# Patient Record
Sex: Female | Born: 1985 | Race: White | Hispanic: No | Marital: Married | Smoking: Never smoker
Health system: Southern US, Community
[De-identification: ages and names within clinical notes are randomized; demographics above are authoritative.]

## PROBLEM LIST (undated history)

## (undated) DIAGNOSIS — K219 Gastro-esophageal reflux disease without esophagitis: Secondary | ICD-10-CM

## (undated) DIAGNOSIS — N200 Calculus of kidney: Secondary | ICD-10-CM

## (undated) DIAGNOSIS — N939 Abnormal uterine and vaginal bleeding, unspecified: Secondary | ICD-10-CM

## (undated) DIAGNOSIS — B019 Varicella without complication: Secondary | ICD-10-CM

## (undated) DIAGNOSIS — G4730 Sleep apnea, unspecified: Secondary | ICD-10-CM

## (undated) DIAGNOSIS — D259 Leiomyoma of uterus, unspecified: Secondary | ICD-10-CM

## (undated) DIAGNOSIS — E875 Hyperkalemia: Secondary | ICD-10-CM

## (undated) DIAGNOSIS — J45909 Unspecified asthma, uncomplicated: Secondary | ICD-10-CM

## (undated) DIAGNOSIS — D72829 Elevated white blood cell count, unspecified: Secondary | ICD-10-CM

## (undated) DIAGNOSIS — R1011 Right upper quadrant pain: Secondary | ICD-10-CM

## (undated) DIAGNOSIS — G4733 Obstructive sleep apnea (adult) (pediatric): Secondary | ICD-10-CM

---

## 2009-05-18 ENCOUNTER — Ambulatory Visit: Payer: Self-pay | Admitting: Sports Medicine

## 2009-05-18 DIAGNOSIS — M216X9 Other acquired deformities of unspecified foot: Secondary | ICD-10-CM

## 2009-05-24 ENCOUNTER — Ambulatory Visit: Payer: Self-pay | Admitting: Family Medicine

## 2009-06-14 ENCOUNTER — Ambulatory Visit: Payer: Self-pay | Admitting: Family Medicine

## 2009-06-14 DIAGNOSIS — E669 Obesity, unspecified: Secondary | ICD-10-CM

## 2009-06-28 ENCOUNTER — Ambulatory Visit: Payer: Self-pay | Admitting: Family Medicine

## 2009-07-12 ENCOUNTER — Ambulatory Visit: Payer: Self-pay | Admitting: Family Medicine

## 2009-07-26 ENCOUNTER — Ambulatory Visit: Payer: Self-pay | Admitting: Family Medicine

## 2009-08-11 ENCOUNTER — Ambulatory Visit: Payer: Self-pay | Admitting: Family Medicine

## 2009-09-06 ENCOUNTER — Ambulatory Visit: Payer: Self-pay | Admitting: Family Medicine

## 2009-09-29 ENCOUNTER — Ambulatory Visit: Payer: Self-pay | Admitting: Family Medicine

## 2009-10-13 ENCOUNTER — Ambulatory Visit: Payer: Self-pay | Admitting: Family Medicine

## 2009-11-01 ENCOUNTER — Encounter: Payer: Self-pay | Admitting: Family Medicine

## 2009-11-15 ENCOUNTER — Ambulatory Visit: Payer: Self-pay | Admitting: Family Medicine

## 2009-12-20 ENCOUNTER — Encounter: Payer: Self-pay | Admitting: Family Medicine

## 2010-01-03 ENCOUNTER — Ambulatory Visit
Admission: RE | Admit: 2010-01-03 | Discharge: 2010-01-03 | Payer: Self-pay | Source: Home / Self Care | Attending: Sports Medicine | Admitting: Sports Medicine

## 2010-01-03 DIAGNOSIS — M7580 Other shoulder lesions, unspecified shoulder: Secondary | ICD-10-CM

## 2010-01-31 NOTE — Assessment & Plan Note (Signed)
Summary: Seeing Catherine Fields @ 2:30 / [facility]   Vital Signs:  Patient profile:   25 year old female Height:      64.75 inches Weight:      249.1 pounds BMI:     41.92  Vitals Entered By: Catherine Almas PHD (November 15, 2009 5:03 PM)  History of Present Illness: Assessment:  Spent 30 min w/ pt.  Catherine Fields seems to have gotten back on track with her diet since the honeymoon, but she has not managed to get back to exercise.  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    Nutrition Diagnosis:  Still no progress on physical inactivity (NB-2.1) related to wt loss goals as evidenced by no exercise in recent wks.  Good progress on excessive energy intake (NI-1.5) related to expenditure as evidenced by wt loss of 4.5 lb.    Intervention:  See Patient Instructions.    Monitoring/Eval:  Catherine Fields will come to [facility] for wt checks every other week, then schedule appt in Jan.      Other Orders: No Charge Patient Arrived (NCPA0) (NCPA0)  Patient Instructions: 1)  Keep up the good work on [employer].  2)  Try to work exercise time back into your week! 3)  Continue to be as active as you can be during the day.   4)  Wt chk every other week through end of year.   5)  Let's plan on an appt in January.     Orders Added: 1)  No Charge Patient Arrived (NCPA0) [NCPA0]

## 2010-01-31 NOTE — Assessment & Plan Note (Signed)
Summary: to see Catherine Fields at 1:30pm/kh   Vital Signs:  Patient profile:   25 year old female Height:      64.75 inches Weight:      261.5 pounds BMI:     44.01  Vitals Entered By: Wyona Almas PHD (July 26, 2009 2:25 PM)  History of Present Illness: Assessment:  Spent 30 minutes with pt.  Catherine Fields has continued to do well, making good food choices most of the time, and exercising regularly with the exception of last week when she hurt her back golfing.  We talked re. food cravings and urges today, and Kirk feels she has a pretty good handle on this at this time, especially with the help of her mom, who has been very supportive of Catherine Fields's efforts.    Nutrition Diagnosis:  Continued progress on physical inactivity (NB-2.1) related to wt loss goals as evidenced by consistent participation in exercise classes.  Progress noted on excessive energy intake (NI-1.5) related to expenditure as evidenced by wt loss of >3 lb.    Intervention:  See Patient Instructions.    Monitoring/Eval:  Dietary intake, body weight, and exercise at 2-wk F/U.      Other Orders: No Charge Patient Arrived (NCPA0) (NCPA0)  Patient Instructions: 1)  CONSCIOUS CHOICES for both diet and exercise.   2)  Beware BEVERAGE CHOICES.  Alcoholic beverages:  Alternate alcoholic drinks with water or non-caloric beverages.   3)  The 3 Ds of dealing with urges:  Delay, Distract, and DIstance.   4)  THEN:  Determine:  What's happening now, and what are my options? 5)  Lastly:  Decide on your action plan.   6)  Review today's handout on food decisions algorithm as needed.  7)  Exercise:  Keep up at least 3 X wk, and remember that 5-6 X wk is even better.   8)  Daily food records! 9)  Follow-up appt:  Aug 11 @ 3:30 & 3:45 in [facility].

## 2010-01-31 NOTE — Assessment & Plan Note (Signed)
Summary: [facility] Pl[facility] / [facility]   Vital Signs:  Patient profile:   25 year old female Height:      64.75 inches Weight:      256.8 pounds BMI:     43.22  Vitals Entered By: Wyona Almas PHD (September 29, 2009 1:55 PM)  History of Present Illness: Assessment:  Lauren and Jeliyah went to the beach with 10 other women last week for Makaylynn's bachelorette party.  They said they started off pretty well, having bought some healthy foods.  They ended up eating a lot of salty snack foods as well as some sweets, and drank more alcohol than planned, all of which were abundantly available.  They ran one day there.  We talked about what they might have done differently, and how much better they would have felt had they made specific plans for food and exercise.  They will both be participating in the Women's Only 5K this Saturday.  We talked some about "social eating guidelines," which Lauren did not remember to do from last appt, and about the importance of empowerment and a vision of success.    Nutrition Diagnosis:  Continued progress on physical inactivity (NB-2.1) related to wt loss goals as evidenced by getting back on track with exercise classes & 5K training group.  No further progress on excessive energy intake (NI-1.5) related to expenditure as evidenced by reported intake on recent beach trip as well as by wt gain of >1 lb.    Intervention:  See Patient Instructions.    Monitoring/Eval:  Dietary intake, body weight, and exercise at 2-wk F/U.    Other Orders: No Charge Patient Arrived (NCPA0) (NCPA0)  Patient Instructions: 1)  Next road trip:  (A) Plan foods you'll bring with you to cover at least the first 24 hrs.  Think about healthy snacks that will satisfy, i.e., string cheese, yogurt, fruit, pretzels, nuts & seeds in individual portions, envelopes of almond/peanut butter ([employer]) with crackers/rice cakes; (B) Plan and follow-thru with exercise - especially in the morning.   2)   PLAN AHEAD.   3)  Read the two handouts on self-acceptance and success strategies provided today, and email me with your observations, thoughts, comments, feelings.   4)  Please schedule an appt for Oct 13.

## 2010-01-31 NOTE — Assessment & Plan Note (Signed)
Summary: to see sykes/eo  Nurse Visit   Vital Signs:  Patient profile:   25 year old female Height:      64.75 inches Weight:      265.9 pounds BMI:     44.75  Vitals Entered By: Wyona Almas PHD (June 28, 2009 8:10 PM)  History of Present Illness: Assessment:  Lauren and Alianis have been keeping up exercise well, and are planning to start the running training that precedes the Women's Only 5 K in Oct.  Food choices have been more challenging, however.  Obstacles have included foods brought in by coworkers left in the break room, eating out with friends at places where it's hard to make healthy choices, still wanting foods like fried chicken and [place] fries, and getting sick of salads.  Discussion today focused a lot on getting full satisfaction from meals by making them balanced and complete.  We also talked of figuring out what foods and eating patterns support good choices by preventing excessive appetite.  Both Toni Amend and Lauren know which foods are better for their health and weight loss efforts.  Where they need help is in translating that knowledge to behavior.  They both are tracking Wt Watchers points only selectively.  Food records emailed to me last week reflect problems indicated above, i.e., fried foods, social eating, meals without veg's.    Nutrition Diagnosis:  Continued progress on physical inactivity (NB-2.1) related to wt loss goals as evidenced by consistent participation in exercise classes.  Some progress on excessive energy intake (NI-1.5) related to expenditure as evidenced by wt loss of 4.3 lb.    Intervention:  See Patient Instructions.    Monitoring/Eval:  Dietary intake, body weight, and exercise at 2-wk F/U.     Patient Instructions: 1)  Track your breakfast intake:  What, how much, and what time.  Also write down the time at which you first notice hunger.   2)  Pay attention also to your snacks, and how they affect your appetite and in general how you  feel after eating.   3)  Some advance preparation of fruit and veg's may be the difference between a snack you are happy about or not so happy about.   4)  Try a low-fat salad dressing.  If you find one you like, bring it to work for lunches in [employer].   5)  Include a veg and/or fruit at both lunch and dinner.  6)  Use your microwave for any frozen or fresh veg's.  Keep frozen veg's on hand.   7)  The best foods for helping control appetite are those that are less processed.   8)  Keep up the good work on your exercise!   Orders Added: 1)  No Charge Patient Arrived (NCPA0) [NCPA0]

## 2010-01-31 NOTE — Assessment & Plan Note (Signed)
Summary: NEW PT TO SEE [name]/[facility]   Vital Signs:  Patient profile:   25 year old female Height:      64.75 inches Weight:      270.9 pounds BMI:     45.59  Vitals Entered By: Wyona Almas PHD (May 24, 2009 2:33 PM)  History of Present Illness: Assessment:  Spent 60 minutes with patient.  Catherine Fields is participating in the [employer] Loss Challenge together with friend Catherine Fields.  They have recently started exercising; usual exercise includes 2 days of 45 min cardio and 45-min body pump class 2 X wk.  She walks her dog  ~30 min 2 X wk.  Catherine Fields eats breakfas, lunch & dinner, and 1-2 snacks daily.  She takes no med's or supplements.  24-hr recall suggests an intake of 1500-1600:  B- light yogurt (80 kcal), 1 banana; L (12 PM)- 2 small slc ham & pineapple pizza, water; Snk (3:30 PM)-  ~8 Sunchips (70 kcal); D (7:00 PM)- chx breast, 2 c pasta salad, 2 c roasted veg's.  Catherine Fields usually experiences hunger pre-breakfast, mid-AM, 3:30 pm, and  ~6 PM.  Catherine Fields is getting married Oct 15, so has an added incentive for wt loss.    Nutrition Diagnosis:   Excessive energy intake (NI-1.5) related to expenditure as evidenced by BMI of >45.   Intervention: See Patient Instructions.    Monitoring/Eval: Dietary intake, body weight, and exercise at 2-wk F/U.      Other Orders: No Charge Patient Arrived (NCPA0) (NCPA0)  Patient Instructions: 1)  Program on Mindful Eating and Living:  Wed, May 25, 7 PM at [employer], [address]   2)  Food Plan:  9 BREAD/STARCH, 4 VEGETABLES, 8 oz MEAT, 3 FRUIT, 2 MILK, 5 FATS. 3)  Design some menus using these food plans.  Bring back to your next appt for review/modification.  4)  Appt:  June 7 at 2:00 PM.   5)  At subsequent appts, we will explore emotional eating, physical activity, and appetite management.

## 2010-01-31 NOTE — Miscellaneous (Signed)
Summary: Weight Check  Clinical Lists Changes  Observations: Added new observation of WEIGHT: 253.7 lb (10/31/2009 12:18)

## 2010-01-31 NOTE — Assessment & Plan Note (Signed)
Summary: Seeing Catherine Fields / [facility]   Vital Signs:  Patient profile:   25 year old female Height:      64.75 inches Weight:      255.5 pounds BMI:     43.00  Vitals Entered By: Wyona Almas PHD (September 06, 2009 11:23 AM)  History of Present Illness: Assessment:  Spent 30 minutes with pt.  Catherine Fields has lost 6 lb in  ~3 wks.  She continues to exercise regularly (body pump and Women's Only 5K training), and said she has stopped drinking almost all alcohol since May.  Catherine Fields said she tends to eat the same foods repetitively, but so far is ok with that.  Her wedding is on Oct 15, and she has a bachelorette trip to [facility] Sept 22-26.  We talked about how she will not have more fun on the trip if she makes poor food choices and drinks too much, that in fact the opposite may be true.    Nutrition Diagnosis:  Stable progress on physical inactivity (NB-2.1) related to wt loss goals as evidenced by consistent participation in exercise classes & 5K training group.  Clear progress on excessive energy intake (NI-1.5) related to expenditure as evidenced by 6-pound weight loss.    Intervention:  See Patient Instructions.    Monitoring/Eval:  Dietary intake, body weight, and exercise at 3-wk F/U.     Other Orders: No Charge Patient Arrived (NCPA0) (NCPA0)  Patient Instructions: 1)  Talk with Lauren about, think about, write about your self-image as empowered, capable, and disciplined to make food and exercise choices that are in your best interest.   2)  Watch for "red flags" that you are tiring of your usual foods, and incorporate variety as needed.   3)  Be sure to PLAN on and to incorporate exercise and some healthy food choices to your upcoming [facility] trip (and honeymoon). You'll actually have a better time than if you make mostly poor food choices and don't exercise.

## 2010-01-31 NOTE — Assessment & Plan Note (Signed)
Summary: Seeing Mitul Hallowell @ 56 / [facility]   Vital Signs:  Patient profile:   25 year old female Height:      64.75 inches Weight:      270.2 pounds BMI:     45.48  Vitals Entered By: Wyona Almas PHD (June 14, 2009 12:31 PM)  History of Present Illness: Assessment:  Lauren and Daylin have started Weight Watchers (38 pts for Lauren and 40 pts for [place]), and have continued to eat most meals together M-F.  They both say that weekends are hard to stay on track b/c schedules are so variable and there are so many social situations.  We discussed at length Tinia's choice of fried Oreos on Sunday when she was at a bar with friends.  Reviewed the concepts of hyperpalatable foods being irresistable; the importance of satisfxn from foods; awareness, conscious food choices, and conscious eating; and the importance of structure.    Nutrition Diagnosis:  Cannot evaluate physical inactivity (NB-2.1) related to wt loss goals b/c we spent the whole hour talking re. food choices.  Some progress on excessive energy intake (NI-1.5) related to expenditure as evidenced by wt loss of 0.7 lb.    Intervention:  See Patient Instructions.    Monitoring/Eval:  Dietary intake, body weight, and exercise at 2-wk F/U.     Other Orders: No Charge Patient Arrived (NCPA0) (NCPA0)  Patient Instructions: 1)  Remember the importance of getting satisfaction from your food as you work on improving your food choices.   2)  The 3 questions of a good food decision:  (1) How hungry am I?; (2) What am I in the mood for?; and (3) What's good for me? 3)  Daily food record, including what, what time, and how much you eat.  Email to Dr. Gerilyn Pilgrim in about a week.   4)  Keeping a food record will help create awareness, which will help to make your food choices as well as eating itself more CONSCIOUS, i.e., slowing down eating speed.   5)  Build in structure to the weekends, i.e., 3 meals/day.  Plan meal times, including breakfast within  the first hour of being up.   6)  Books:  Meet at least twice a week for the next 2 weeks to discuss G Roth's book and the [employer].  Be prepared to discuss when you come to your follow-up appt in 2 wk.   7)  Two last suggestions:  (a) Keep in mind the strategies of the food industry as they make products specifically designed to make [place] overeat!; and (b) Try to view these lifestyle changes as learning to take care of yourself, for which the reward is feeling better, looking better, and performing better - empowerment!

## 2010-01-31 NOTE — Assessment & Plan Note (Signed)
Summary: WELLNESS PHYSICAL FOR [facility] FITNESS PROJECT/[facility]   Vital Signs:  Patient profile:   25 year old female Height:      64.75 inches Weight:      272 pounds BMI:     45.78 BP sitting:   129 / 89  Vitals Entered By: Lillia Pauls CMA (May 18, 2009 9:10 AM)  History of Present Illness: PT entering [facility] program for fitness and wt loss has done well thus far concerned that she does not get into injury issues has already had some foot pain and has HX of Past bilateral forefoot pain on attempting jogging.  No chronic medical problems. No medications. No allergies. No personal or family history of cardiac disease or suddent cardiac death.  Physical Exam  General:  Well-developed,well-nourished,in no acute distress; alert,appropriate and cooperative throughout examination Head:  Normocephalic and atraumatic without obvious abnormalities. No apparent alopecia or balding. Eyes:  No corneal or conjunctival inflammation noted. EOMI. Perrla. Funduscopic exam benign, without hemorrhages, exudates or papilledema. Vision grossly normal. Neck:  No deformities, masses, or tenderness noted. Breasts:  Deferred Lungs:  Normal respiratory effort, chest expands symmetrically. Lungs are clear to auscultation, no crackles or wheezes. Heart:  Normal rate and regular rhythm. S1 and S2 normal without gallop, murmur, click, rub or other extra sounds. Abdomen:  (+)BS. Msk:  BACK: No spinal ttp. No apparent bony deformity. FROM at the waist without pain. TTP of the   (  ). (  )  spasm.  HIPS/PELVIS: No assymmetry. Normal SI jt motion. No ttp/swelling/discoloration. FROM. Full strength except for  (  ).  KNEES: No swelling. No discoloration or increased warmth. No ttp of the knee. No ttp of pes bursa. Full ROM. Full strength. Negative McMurray's/Theasally's. Normal ligament laxity.  ANKLES/FEET/TOES: Long arch collapse on standing. Transverse arch collapse. Splayed 1st/2nd  toes.   Neurologic:  No cranial nerve deficits noted. Station and gait are normal. Plantar reflexes are down-going bilaterally. DTRs are symmetrical throughout. Sensory, motor and coordinative functions appear intact. Psych:  Cognition and judgment appear intact. Alert and cooperative with normal attention span and concentration. 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 No apparent bony deformity. FROM at the waist without pain. No muscular ttp.  HIPS/PELVIS: No assymmetry. Normal SI jt motion. No ttp/swelling/discoloration. FROM. Full strength.  GAIT: Dynamic pronation on ambulation   PLAN: 1) Acquired Deformity of Foot/Ankle: Dispensed a pair of comforthotics for using in running shoes. Has a supportive pair of [employer]ics running shoes at home. Try exercises on recumbent bike and elliptical with transition toward running as tolerated. Daily pidgeon-toe heel raises and pidgeon-toe walks as demonstrated during this encounter.  RTC in a few weeks if she starts to experience foot pain or if she has any other concerns. If foot pain recurs, then will consider custom orthotic construction given plans to undertake running activities.  2) Wellness Physical According to Ms. Dimont, the wellness program will consist of yoga, pilates, ligh weight training, and various running activities. She is cleared for participation based on her history and clinical exam.

## 2010-01-31 NOTE — Assessment & Plan Note (Signed)
Summary: nutr. appt per Libbi Towner/eo   Vital Signs:  Patient profile:   25 year old female Height:      64.75 inches Weight:      261.5 pounds BMI:     44.01  Vitals Entered By: Wyona Almas PHD (August 11, 2009 3:41 PM)  History of Present Illness: Assessment:  Fenna said she didn't do as well with diet/ex during the past 2 wks while her friend and partner in weight loss efforts Leotis Shames) was away, e.g., she has not been to Body Pump class in 2 weeks.  Although not keeping food records, Chemeka said she feels confident in being able to recall all of her intake; she has been making conscious choices, and feels she is doing generally well in that regard.  We talked today about the importance of physical activity throughout the day, in addn to structured (workout) exercise.    Nutrition Diagnosis:  Stable progress on physical inactivity (NB-2.1) related to wt loss goals as evidenced by consistent participation in exercise classes & 5K training group.  No further progress on excessive energy intake (NI-1.5) related to expenditure as evidenced by no further weight loss.    Intervention:  See Patient Instructions.    Monitoring/Eval:  Dietary intake, body weight, and exercise at 3-wk F/U.    Other Orders: No Charge Patient Arrived (NCPA0) (NCPA0)  Patient Instructions: 1)  Exercise opportunities during the day:  Make an even greater commitment to consciously look for ways to MOVE.   2)  Get back into your exercise routine, including Body Pump class.   3)  Work out your schedule to incorporate more resistance exercise, even if workouts are shorter than what you usually do with weight training.   4)  At your next appt, bring a 3-day food record.   5)  BREAKFAST:  If you use cereal, look for at least 5 g fiber per serving.    6)  Weigh to Wellness class is Sept 13 - Oct 18, Tuesdays 5:30-6:30.  Confirm with me if you decide to register.   7)  Please schedule an appt for 11:30 with Dr. Gerilyn Pilgrim  (11:45 on Nurse schedule) Aug 30.    Appended Document: Orders Update    Clinical Lists Changes  Orders: Added new Service order of No Charge Patient Arrived (NCPA0) (NCPA0) - Signed

## 2010-01-31 NOTE — Assessment & Plan Note (Signed)
Summary: Seeing Sykes @ 1:30 / [facility]   Vital Signs:  Patient profile:   25 year old female Height:      64.75 inches Weight:      264.9 pounds BMI:     44.58  Vitals Entered By: Wyona Almas PHD (July 12, 2009 1:37 PM)  History of Present Illness: Assessment:  Catherine Fields said she was not surprised by no wt loss this week, having made some poor food choices over July 4th weekend.  We talked of the importance of making conscious choices on all days, including celebrations.  Also reminded Leta and friend Leotis Shames that if they bring a healthier food option to a gathering, invariably there will be someone else who is happy to have that option available.  Discussed the importance of their playing a leadership role among their friends and family if no one else is going to set good examples.  Clela has been exercising every day at least once a day, walking/jogging 35-45 min, Zumba, and body pump classes.    Nutrition Diagnosis:  Continued progress on physical inactivity (NB-2.1) related to wt loss goals as evidenced by consistent participation in exercise classes.  No further progress on excessive energy intake (NI-1.5) related to expenditure as evidenced by no further wt loss.    Intervention:  See Patient Instructions.    Monitoring/Eval:  Dietary intake, body weight, and exercise at 2-wk F/U.      Other Orders: No Charge Patient Arrived (NCPA0) (NCPA0)  Patient Instructions: 1)  PLAN AHEAD for meals and snacks.    2)  SLOW DOWN when eating.  Fork down between bites.  Pause before your meal and pause at least once for 30 seconds during the meal.   3)  Emphasis this week:  Conscious choices and conscious eating! 4)  Keep up the daily exercise.   5)  Next appt Tuesday, July 26, 1:30.   6)  AT F/U WE WILL TALK ABOUT THE BALANCE OF BETTER OR WORSE CHOICES, i.e., HOW MANY GOOD CHOICES CAN BE NEGATED BY ONE EVENING OF POOR CHOICES.

## 2010-01-31 NOTE — Assessment & Plan Note (Signed)
Summary: nutr appt/eo   Vital Signs:  Patient profile:   25 year old female Height:      64.75 inches Weight:      250.6 pounds BMI:     42.18  Vitals Entered By: Wyona Almas PHD (October 13, 2009 12:16 PM)  History of Present Illness: Assessment:  Brittny's wedding is on Saturday, and she has been very busy with plans.  She has not exercised since the Women's Only 5K on Oct 1, but she has apparently continued careful food choices, including no alcohol, b/c she has lost 6 lb in 2 wks.  She and Lauren are now looking forward to training for the [employer] in [place], a real challenge b/c it is 10K, a distance they've not done before.    Nutrition Diagnosis:  Regression on physical inactivity (NB-2.1) related to wt loss goals as evidenced by no exercise in almost 2 wks.  Good progress on excessive energy intake (NI-1.5) related to expenditure as evidenced by wt loss of 6 lb.    Intervention:  See Patient Instructions.    Monitoring/Eval:  Wt chk in 3 wks.     Other Orders: No Charge Patient Arrived (NCPA0) (NCPA0)  Patient Instructions: 1)  Future incentive:  [employer] in March. Remember, the more you lose now, the less there is to carry over 10K.   2)  Goals:  Running or other exercise:  5 X wk.   3)  3 REAL meals/day.   4)  Veg's 2 X day.   5)  Fried foods limited to 1 X wk.

## 2010-02-02 NOTE — Assessment & Plan Note (Signed)
Summary: SHOULDER INJURY/MJD   Vital Signs:  Patient profile:   25 year old female Height:      65 inches Weight:      250 pounds BP sitting:   124 / 85  Vitals Entered By: Lillia Pauls CMA (January 03, 2010 9:15 AM)  CC:  left shoulder pain.  History of Present Illness: left shoulder pain and weakness.  going on 6 weeks, no injury.  got worse until having a shooting pain while lifting shoulder.  Does body pump twice a week and noticed that she was having pain with shoulder extension.  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  left shoulder feels weaker with exercise.  This started when she started exercising again after a break.   has noticed numbness and tingling and weakness in left hand x several years.  occasionally with shooting pain at elbow.    Current Medications (verified): 1)  None  Allergies (verified): 1)  ! Sulfa  Review of Systems  The patient denies chest pain, syncope, and headaches.    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  advised scapular retraction exercises with 5 lb weights 3 sets of 15.  May continue body pump class.  Focus on posture and neck position.  RTC if worsening.   Orders Added: 1)  Est. Patient Level III [57846]

## 2010-02-02 NOTE — Miscellaneous (Signed)
Summary: Weight Check  Clinical Lists Changes  Observations: Added new observation of WEIGHT: 248.6 lb (11/30/2009 17:07)

## 2010-03-28 ENCOUNTER — Ambulatory Visit (INDEPENDENT_AMBULATORY_CARE_PROVIDER_SITE_OTHER): Payer: Commercial Managed Care - PPO | Admitting: Family Medicine

## 2010-03-28 ENCOUNTER — Encounter: Payer: Self-pay | Admitting: Family Medicine

## 2010-03-28 VITALS — BP 112/88 | Ht 65.0 in | Wt 230.0 lb

## 2010-03-28 DIAGNOSIS — M79609 Pain in unspecified limb: Secondary | ICD-10-CM

## 2010-03-28 DIAGNOSIS — M79673 Pain in unspecified foot: Secondary | ICD-10-CM | POA: Insufficient documentation

## 2010-03-28 NOTE — Progress Notes (Signed)
  Subjective:    Patient ID: Catherine Fields, female    DOB: [birth year], 25 y.o.   MRN: [id number]  HPI 25yo female to office c/o L foot pain x 3-4 days.  Pain along lateral aspect of foot, no associated swelling or bruising.  Pain started after walking around the [facility] this weekend while wearing flip flops.  Currently running 12-15 miles/wk training for 10k race this weekend.  Has not done any running over the past several days.  Not taking any medications for this, but is icing periodically which is helpful.  Pain has been improving.  Notes increased pain with flip-flops, less discomfort with supportive shoes.  Has Sports Insoles that she wears in regular shoes, has not been using in running shoes b/c feel tight at times.  No hx of stress fractures.  Denies numbness/tingling of the foot.  Limping periodically.   Review of Systems Per HPI    Objective:   Physical Exam GEN: AOx3, NAD SKIN: no rashes/lesions MSK:  - Feet: Collapse of longitudinal arch b/l, collapse of transverse arch b/l with splaying of 1st & 2nd toes.  No swelling or bruising noted.  Lt foot with mild TTP along proximal 4th & 5th MT, pain with MT squeeze, able to do hop test - but with pain, good great toe motion, no PF tenderness.  Rt foot with no midfoot or forefoot tenderness, able to perform hop test without difficulty - Gait: slight antalgic gait favoring left foot, no leg length difference  MSK U/S: Lt foot- no cortical irregularity or periosteal thickening along MTs, possible area of surrounding edema at proximal 5th MT, no increased doppler flow.  No stress fx seen with long or short views.  Images saved.   Assessment & Plan:   Problem List as of 03/28/2010        Foot pain   Last Assessment & Plan Note   03/28/2010 Office Visit Signed 03/28/2010  6:32 PM by Claris Che, MD    - MSK ultrasound showing no definitive signs of stress fracture, may have small amount of surrounding edema - Fitted with arch  strap for added compression - Recommended she start wearing Sports Insoles regularly, should try to run with them if possible - Wear good supportive shoes, try to avoid being barefoot or wearing flip flops - Recommend she rest for this week - may do elliptical or stationary bike for cardio - May consider custom orthotics in the future if Sports Insoles uncomfortable in her running shoes - May attempt to run race this weekend if feeling better, should stop if pain >3/10 or if limping - f/u 2-3 weeks if symptoms persist, otherwise as needed

## 2010-03-28 NOTE — Assessment & Plan Note (Signed)
-   MSK ultrasound showing no definitive signs of stress fracture, may have small amount of surrounding edema - Fitted with arch strap for added compression - Recommended she start wearing Sports Insoles regularly, should try to run with them if possible - Wear good supportive shoes, try to avoid being barefoot or wearing flip flops - Recommend she rest for this week - may do elliptical or stationary bike for cardio - May consider custom orthotics in the future if Sports Insoles uncomfortable in her running shoes - May attempt to run race this weekend if feeling better, should stop if pain >3/10 or if limping - f/u 2-3 weeks if symptoms persist, otherwise as needed

## 2011-03-07 ENCOUNTER — Emergency Department: Payer: Self-pay | Admitting: Emergency Medicine

## 2011-03-07 LAB — CBC
MCH: 28.9 pg (ref 26.0–34.0)
MCV: 85 fL (ref 80–100)
Platelet: 322 10*3/uL (ref 150–440)
RDW: 12.6 % (ref 11.5–14.5)

## 2011-03-07 LAB — COMPREHENSIVE METABOLIC PANEL
Albumin: 3.7 g/dL (ref 3.4–5.0)
Alkaline Phosphatase: 71 U/L (ref 50–136)
Anion Gap: 13 (ref 7–16)
BUN: 20 mg/dL — ABNORMAL HIGH (ref 7–18)
Bilirubin,Total: 0.3 mg/dL (ref 0.2–1.0)
Calcium, Total: 9 mg/dL (ref 8.5–10.1)
Chloride: 103 mmol/L (ref 98–107)
Co2: 26 mmol/L (ref 21–32)
Creatinine: 1.01 mg/dL (ref 0.60–1.30)
EGFR (African American): >60
EGFR (Non-African Amer.): >60
Glucose: 101 mg/dL — ABNORMAL HIGH (ref 65–99)
Osmolality: 286 (ref 275–301)
Potassium: 3.6 mmol/L (ref 3.5–5.1)
SGOT(AST): 38 U/L — ABNORMAL HIGH (ref 15–37)
SGPT (ALT): 88 U/L — ABNORMAL HIGH
Sodium: 142 mmol/L (ref 136–145)
Total Protein: 8 g/dL (ref 6.4–8.2)

## 2011-03-07 LAB — URINALYSIS, COMPLETE
Bacteria: NONE SEEN
Bilirubin,UR: NEGATIVE
Glucose,UR: NEGATIVE mg/dL (ref 0–75)
Ketone: NEGATIVE
Leukocyte Esterase: NEGATIVE
Nitrite: NEGATIVE
Ph: 5 (ref 4.5–8.0)
Protein: NEGATIVE
RBC,UR: 83 /HPF (ref 0–5)
Specific Gravity: 1.02 (ref 1.003–1.030)
Squamous Epithelial: 1
WBC UR: 3 /HPF (ref 0–5)

## 2011-03-15 ENCOUNTER — Ambulatory Visit: Payer: Self-pay | Admitting: Urology

## 2012-11-11 IMAGING — CT CT STONE STUDY
1 of 2 series · 15 of 32 positions shown, 19 images · non-contrast
Comparison: none

REASON FOR EXAM: R FLANK TO RLQ PAIN
COMMENTS:   May transport without cardiac monitor

PROCEDURE:     CT  - CT ABDOMEN /PELVIS WO (STONE)  - March 07, 2011  [DATE]
RESULT:
TECHNIQUE: Helical noncontrasted 3 mm sections were obtained from the lung
bases through the pubic symphysis.

[Series 2: stone · axial · 0.80mm/px · z∈[-521,-77]mm · 15 of 168 slices shown, 19 images]
[im 13/168  soft-tissue]
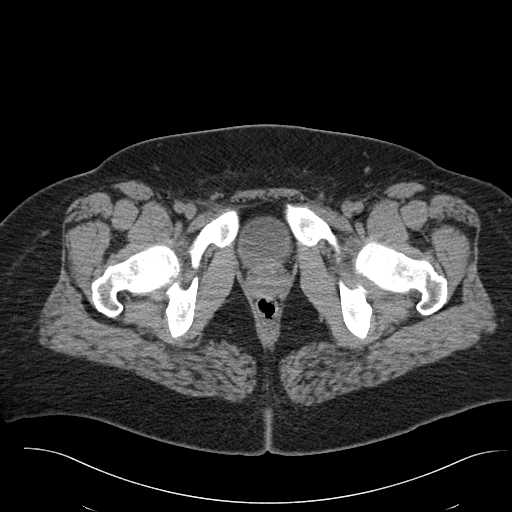
[im 13/168  bone]
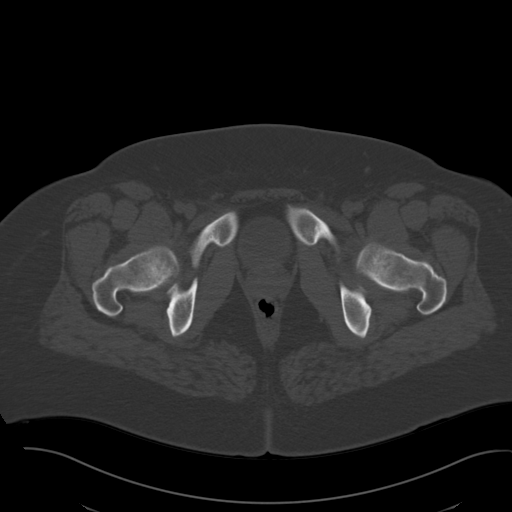
[im 25/168  soft-tissue]
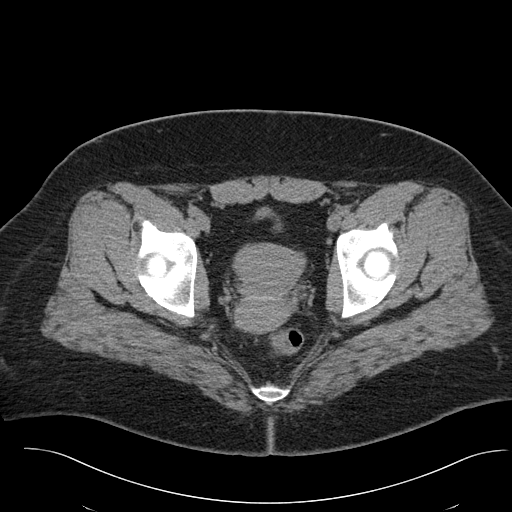
[im 38/168  soft-tissue]
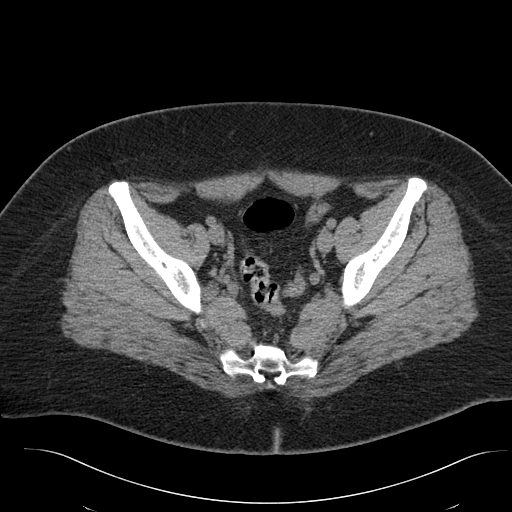
[im 50/168  soft-tissue]
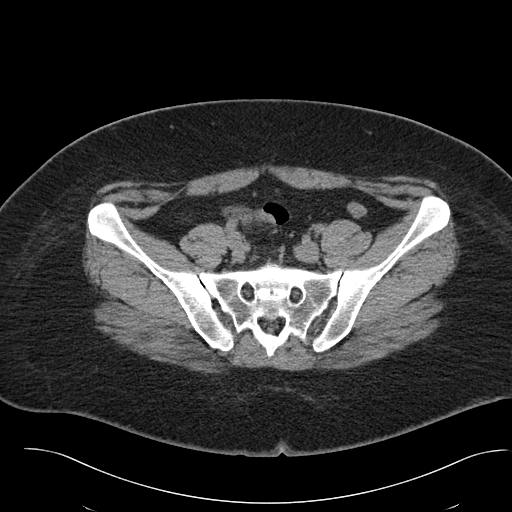
[im 62/168  soft-tissue]
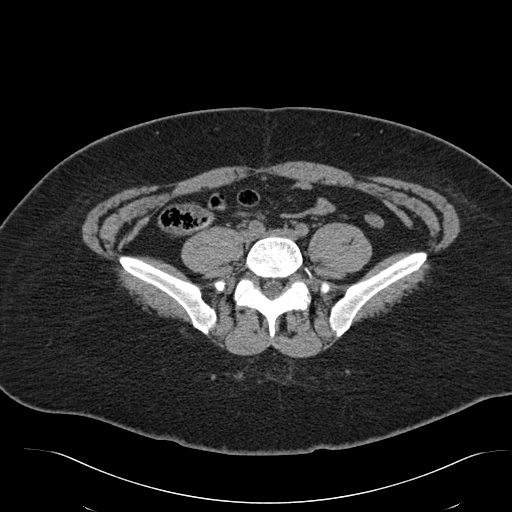
[im 75/168  soft-tissue]
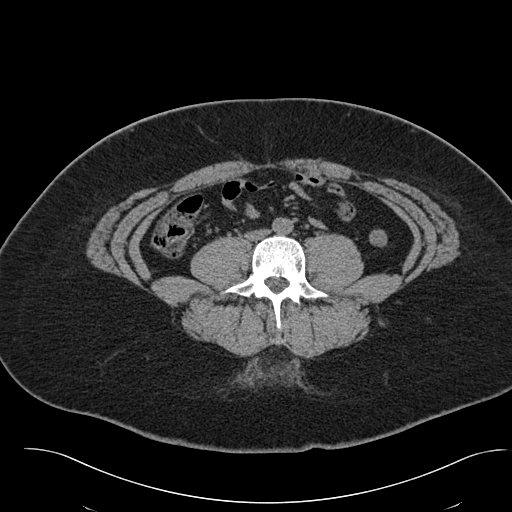
[im 87/168  soft-tissue]
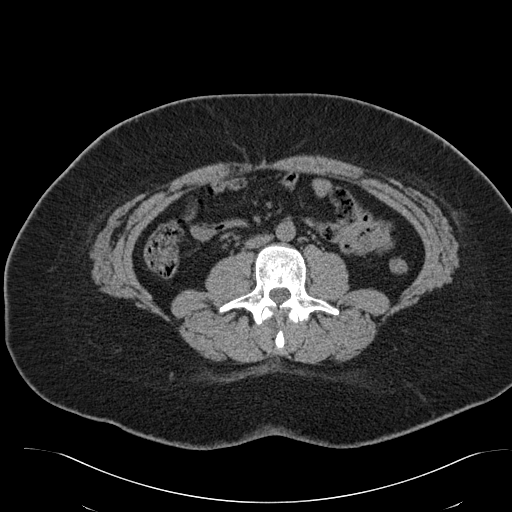
[im 99/168  soft-tissue]
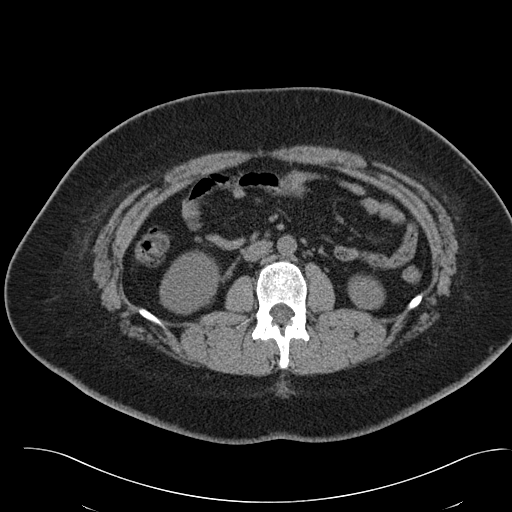
[im 112/168  soft-tissue]
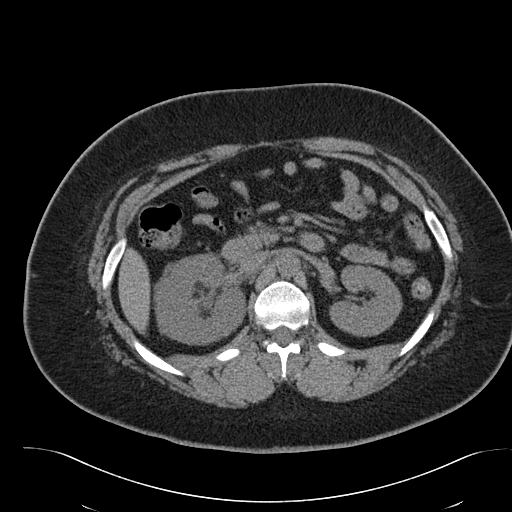
[im 112/168  bone]
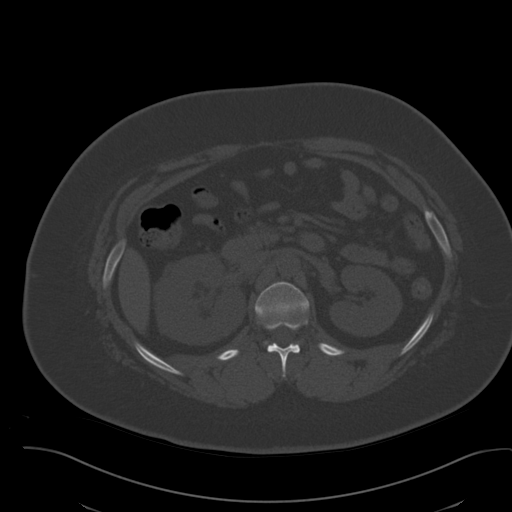
[im 124/168  soft-tissue]
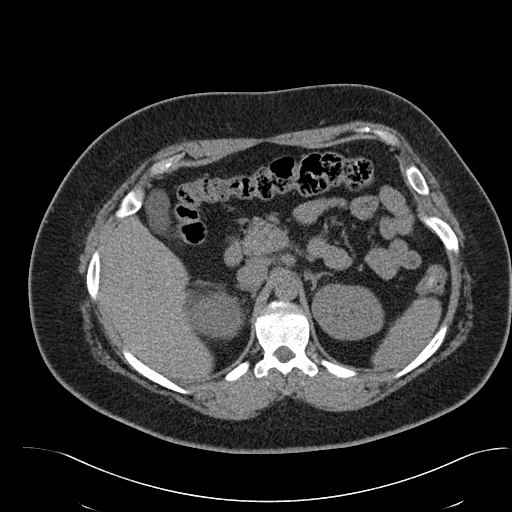
[im 137/168  soft-tissue]
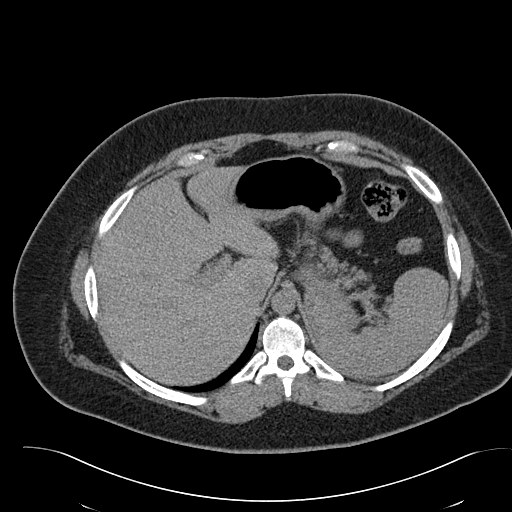
[im 143/168  lung]
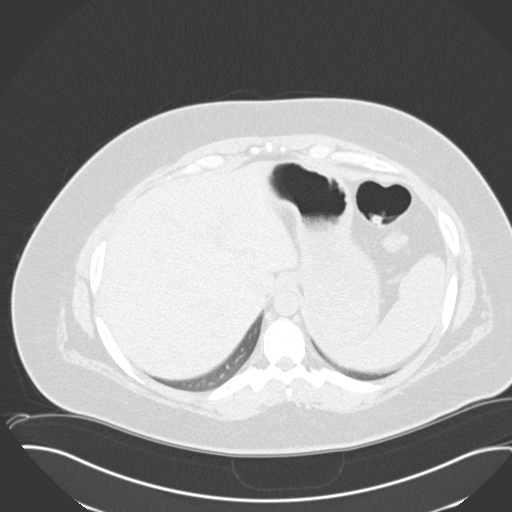
[im 149/168  soft-tissue]
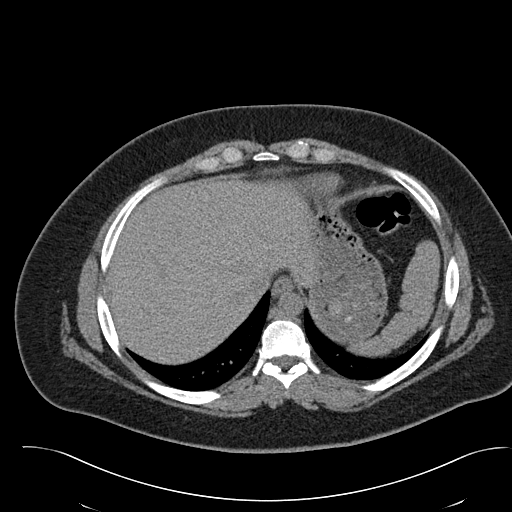
[im 149/168  lung]
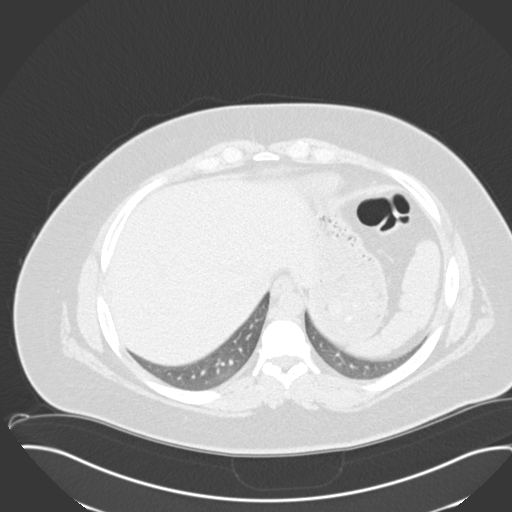
[im 155/168  lung]
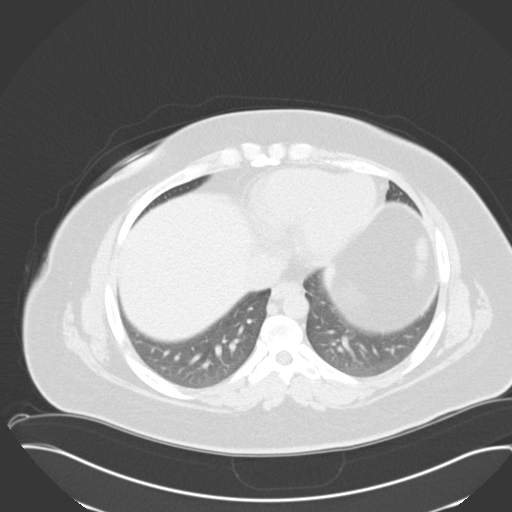
[im 161/168  soft-tissue]
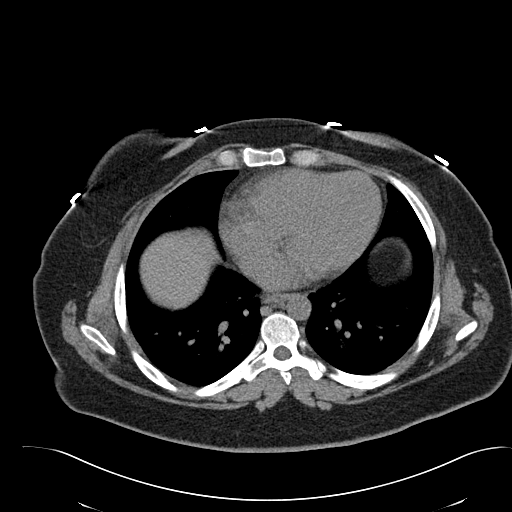
[im 161/168  lung]
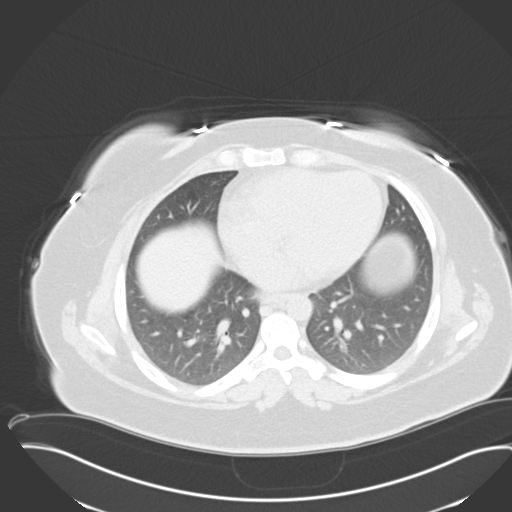

[15 of 32 positions shown; findings below may reference images not displayed]

FINDINGS: The lung bases are unremarkable.

Noncontrast evaluation of the liver, spleen, adrenals, pancreas, and left
kidney are unremarkable. Evaluation of the right kidney demonstrates a 4 mm
calculus within the ureteropelvic junction region. There is mild
hydronephrosis. A nonobstructing 2 mm medullary calculus is also identified.
There is no evidence of hydroureter. The right kidney is edematous and there
is mild stranding in the perinephric fat.

There is no CT evidence of bowel obstruction, enteritis, colitis,
diverticulitis nor appendicitis. There is no evidence of an abdominal aortic
aneurysm. There is no evidence of an abdominal or pelvic free fluid,
loculated fluid collections nor pneumoperitoneum.
IMPRESSION: 1. Mild obstructive uropathy involving the right kidney secondary to a 4 mm
ureteropelvic junction calculus.
2. Dr. Brest of the Emergency Department was informed of these findings via a
preliminary faxed report.

## 2013-08-20 ENCOUNTER — Ambulatory Visit: Payer: Self-pay | Admitting: Medical

## 2013-09-15 DIAGNOSIS — J4530 Mild persistent asthma, uncomplicated: Secondary | ICD-10-CM | POA: Insufficient documentation

## 2014-02-09 ENCOUNTER — Ambulatory Visit: Payer: Self-pay | Admitting: Specialist

## 2014-03-19 ENCOUNTER — Ambulatory Visit: Payer: Self-pay | Admitting: Specialist

## 2014-08-20 ENCOUNTER — Other Ambulatory Visit: Payer: Self-pay | Admitting: Medical

## 2014-08-20 DIAGNOSIS — R1011 Right upper quadrant pain: Secondary | ICD-10-CM

## 2014-08-24 ENCOUNTER — Ambulatory Visit: Payer: Commercial Managed Care - PPO

## 2014-08-25 ENCOUNTER — Ambulatory Visit
Admission: RE | Admit: 2014-08-25 | Discharge: 2014-08-25 | Disposition: A | Discharge status: Home / Self Care | Payer: BLUE CROSS/BLUE SHIELD | Source: Ambulatory Visit | Attending: Medical | Admitting: Medical

## 2014-08-25 ENCOUNTER — Other Ambulatory Visit: Payer: Self-pay | Admitting: Medical

## 2014-08-25 DIAGNOSIS — R079 Chest pain, unspecified: Secondary | ICD-10-CM | POA: Diagnosis present

## 2014-08-25 DIAGNOSIS — R1011 Right upper quadrant pain: Secondary | ICD-10-CM

## 2014-09-02 ENCOUNTER — Other Ambulatory Visit: Payer: Self-pay | Admitting: Student

## 2014-09-02 DIAGNOSIS — R1084 Generalized abdominal pain: Secondary | ICD-10-CM

## 2014-09-10 ENCOUNTER — Ambulatory Visit
Admission: RE | Admit: 2014-09-10 | Discharge: 2014-09-10 | Disposition: A | Discharge status: Home / Self Care | Payer: BLUE CROSS/BLUE SHIELD | Source: Ambulatory Visit | Attending: Student | Admitting: Student

## 2014-09-10 DIAGNOSIS — R1084 Generalized abdominal pain: Secondary | ICD-10-CM | POA: Insufficient documentation

## 2014-09-10 DIAGNOSIS — M5386 Other specified dorsopathies, lumbar region: Secondary | ICD-10-CM | POA: Insufficient documentation

## 2014-09-10 DIAGNOSIS — N200 Calculus of kidney: Secondary | ICD-10-CM | POA: Diagnosis not present

## 2014-09-10 HISTORY — DX: Unspecified asthma, uncomplicated: J45.909

## 2014-09-10 MED ORDER — IOHEXOL 300 MG/ML  SOLN
100.0000 mL | Freq: Once | INTRAMUSCULAR | Status: AC | PRN
  Administered 2014-09-10: 100 mL via INTRAVENOUS

## 2015-02-04 ENCOUNTER — Encounter: Payer: Self-pay | Admitting: *Deleted

## 2015-02-07 ENCOUNTER — Ambulatory Visit
Admission: RE | Admit: 2015-02-07 | Discharge: 2015-02-07 | Disposition: A | Discharge status: Home / Self Care | Payer: BLUE CROSS/BLUE SHIELD | Source: Ambulatory Visit | Attending: Gastroenterology | Admitting: Gastroenterology

## 2015-02-07 ENCOUNTER — Encounter: Payer: Self-pay | Admitting: *Deleted

## 2015-02-07 ENCOUNTER — Encounter
Admission: RE | Disposition: A | Discharge status: Home / Self Care | Payer: Self-pay | Source: Ambulatory Visit | Attending: Gastroenterology

## 2015-02-07 ENCOUNTER — Ambulatory Visit: Payer: BLUE CROSS/BLUE SHIELD | Admitting: Certified Registered Nurse Anesthetist

## 2015-02-07 DIAGNOSIS — R1011 Right upper quadrant pain: Secondary | ICD-10-CM | POA: Insufficient documentation

## 2015-02-07 DIAGNOSIS — K219 Gastro-esophageal reflux disease without esophagitis: Secondary | ICD-10-CM | POA: Insufficient documentation

## 2015-02-07 DIAGNOSIS — E669 Obesity, unspecified: Secondary | ICD-10-CM | POA: Diagnosis not present

## 2015-02-07 DIAGNOSIS — Z882 Allergy status to sulfonamides status: Secondary | ICD-10-CM | POA: Insufficient documentation

## 2015-02-07 DIAGNOSIS — Z6841 Body Mass Index (BMI) 40.0 and over, adult: Secondary | ICD-10-CM | POA: Diagnosis not present

## 2015-02-07 DIAGNOSIS — Z79899 Other long term (current) drug therapy: Secondary | ICD-10-CM | POA: Diagnosis not present

## 2015-02-07 DIAGNOSIS — Z87442 Personal history of urinary calculi: Secondary | ICD-10-CM | POA: Diagnosis not present

## 2015-02-07 DIAGNOSIS — G4730 Sleep apnea, unspecified: Secondary | ICD-10-CM | POA: Diagnosis not present

## 2015-02-07 HISTORY — DX: Unspecified asthma, uncomplicated: J45.909

## 2015-02-07 HISTORY — DX: Varicella without complication: B01.9

## 2015-02-07 HISTORY — DX: Right upper quadrant pain: R10.11

## 2015-02-07 HISTORY — DX: Gastro-esophageal reflux disease without esophagitis: K21.9

## 2015-02-07 HISTORY — DX: Hyperkalemia: E87.5

## 2015-02-07 HISTORY — DX: Sleep apnea, unspecified: G47.30

## 2015-02-07 HISTORY — DX: Calculus of kidney: N20.0

## 2015-02-07 HISTORY — PX: ESOPHAGOGASTRODUODENOSCOPY (EGD) WITH PROPOFOL: SHX5813

## 2015-02-07 LAB — POCT PREGNANCY, URINE: PREG TEST UR: NEGATIVE

## 2015-02-07 SURGERY — ESOPHAGOGASTRODUODENOSCOPY (EGD) WITH PROPOFOL
Anesthesia: General

## 2015-02-07 MED ORDER — PROPOFOL 10 MG/ML IV BOLUS
INTRAVENOUS | Status: DC | PRN
  Administered 2015-02-07: 130 mg via INTRAVENOUS

## 2015-02-07 MED ORDER — EPHEDRINE SULFATE 50 MG/ML IJ SOLN: INTRAMUSCULAR | Status: DC | PRN

## 2015-02-07 MED ORDER — LIDOCAINE HCL (CARDIAC) 20 MG/ML IV SOLN
INTRAVENOUS | Status: DC | PRN
  Administered 2015-02-07: 100 mg via INTRAVENOUS

## 2015-02-07 MED ORDER — MIDAZOLAM HCL 2 MG/2ML IJ SOLN
INTRAMUSCULAR | Status: DC | PRN
  Administered 2015-02-07: 1 mg via INTRAVENOUS

## 2015-02-07 MED ORDER — SODIUM CHLORIDE 0.9 % IV SOLN
INTRAVENOUS | Status: DC
  Administered 2015-02-07: 15:00:00 via INTRAVENOUS

## 2015-02-07 MED ORDER — PROPOFOL 500 MG/50ML IV EMUL
INTRAVENOUS | Status: DC | PRN
  Administered 2015-02-07: 180 ug/kg/min via INTRAVENOUS

## 2015-02-07 MED ORDER — FENTANYL CITRATE (PF) 100 MCG/2ML IJ SOLN
INTRAMUSCULAR | Status: DC | PRN
  Administered 2015-02-07: 50 ug via INTRAVENOUS

## 2015-02-07 NOTE — Transfer of Care (Signed)
Immediate Anesthesia Transfer of Care Note  Patient: Catherine Fields  Procedure(s) Performed: Procedure(s): ESOPHAGOGASTRODUODENOSCOPY (EGD) WITH PROPOFOL (N/A)  Patient Location: PACU  Anesthesia Type:General  Level of Consciousness: awake  Airway & Oxygen Therapy: Patient Spontanous Breathing and Patient connected to nasal cannula oxygen  Post-op Assessment: Report given to RN and Post -op Vital signs reviewed and stable  Post vital signs: Reviewed and stable  Last Vitals:  Filed Vitals:   02/07/15 1424 02/07/15 1557  BP: 126/62 117/74  Pulse: 99 93  Temp: 36.4 C 36.2 C  Resp: 16 22    Complications: No apparent anesthesia complications

## 2015-02-07 NOTE — Anesthesia Postprocedure Evaluation (Signed)
Anesthesia Post Note  Patient: Catherine Fields  Procedure(s) Performed: Procedure(s) (LRB): ESOPHAGOGASTRODUODENOSCOPY (EGD) WITH PROPOFOL (N/A)  Patient location during evaluation: PACU Anesthesia Type: General Level of consciousness: awake Pain management: satisfactory to patient Vital Signs Assessment: post-procedure vital signs reviewed and stable Respiratory status: spontaneous breathing Cardiovascular status: stable Anesthetic complications: no    Last Vitals:  Filed Vitals:   02/07/15 1424 02/07/15 1557  BP: 126/62 117/74  Pulse: 99 93  Temp: 36.4 C 36.2 C  Resp: 16 22    Last Pain:  Filed Vitals:   02/07/15 1558  PainSc: 1                  VAN STAVEREN,Sidda Humm

## 2015-02-07 NOTE — Op Note (Signed)
[facility] Gastroenterology Patient Name: Catherine Fields Last Procedure Date: 02/07/2015 3:23 PM MRN: [id number] Account #: [id number] Date of Birth: 11/26/1985 Admit Type: Outpatient Age: 30 Room: [facility] ENDO ROOM 2 Gender: Female Note Status: Finalized Procedure:         Upper GI endoscopy Indications:       Abdominal pain in the right upper quadrant(improving) Patient Profile:   This is a 30 year old female. Providers:         Gerrit Heck. Rayann Heman, MD Referring MD:      Estill Dooms. Ratcliffe, MD (Referring MD) Medicines:         Propofol per Anesthesia Complications:     No immediate complications. Procedure:         Pre-Anesthesia Assessment:                    - Prior to the procedure, a History and Physical was                     performed, and patient medications, allergies and                     sensitivities were reviewed. The patient's tolerance of                     previous anesthesia was reviewed.                    After obtaining informed consent, the endoscope was passed                     under direct vision. Throughout the procedure, the                     patient's blood pressure, pulse, and oxygen saturations                     were monitored continuously. The Endoscope was introduced                     through the mouth, and advanced to the second part of                     duodenum. The upper GI endoscopy was accomplished without                     difficulty. The patient tolerated the procedure well. Findings:      The esophagus was normal.      The stomach was normal.      The examined duodenum was normal. Impression:        - Normal esophagus.                    - Normal stomach.                    - Normal examined duodenum.                    - No specimens collected. Recommendation:    - Observe patient in GI recovery unit.                    - Resume regular diet.                    - Continue present medications.                    -  If abdominal pain worsens, obtain HIDA scan.                    - The findings and recommendations were discussed with the                     patient.                    - The findings and recommendations were discussed with the                     patient's family. Procedure Code(s): --- Professional ---                    [phone], Esophagogastroduodenoscopy, flexible, transoral;                     diagnostic, including collection of specimen(s) by                     brushing or washing, when performed (separate procedure) Diagnosis Code(s): --- Professional ---                    R10.11, Right upper quadrant pain CPT copyright 2014 American Medical Association. All rights reserved. The codes documented in this report are preliminary and upon coder review may  be revised to meet current compliance requirements. Mellody Life, MD 02/07/2015 3:46:36 PM This report has been signed electronically. Number of Addenda: 0 Note Initiated On: 02/07/2015 3:23 PM      [facility]

## 2015-02-07 NOTE — Anesthesia Preprocedure Evaluation (Signed)
Anesthesia Evaluation  Patient identified by MRN, date of birth, ID band Patient awake    Reviewed: Allergy & Precautions, NPO status , Patient's Chart, lab work & pertinent test results  Airway Mallampati: II       Dental no notable dental hx.    Pulmonary sleep apnea ,    breath sounds clear to auscultation       Cardiovascular Exercise Tolerance: Good  Rhythm:Regular     Neuro/Psych negative neurological ROS     GI/Hepatic Neg liver ROS, GERD  ,  Endo/Other  negative endocrine ROS  Renal/GU negative Renal ROS     Musculoskeletal negative musculoskeletal ROS (+)   Abdominal (+) + obese,   Peds  Hematology negative hematology ROS (+)   Anesthesia Other Findings   Reproductive/Obstetrics                             Anesthesia Physical Anesthesia Plan  ASA: II  Anesthesia Plan: General   Post-op Pain Management:    Induction: Intravenous  Airway Management Planned: Natural Airway and Nasal Cannula  Additional Equipment:   Intra-op Plan:   Post-operative Plan:   Informed Consent: I have reviewed the patients History and Physical, chart, labs and discussed the procedure including the risks, benefits and alternatives for the proposed anesthesia with the patient or authorized representative who has indicated his/her understanding and acceptance.     Plan Discussed with: CRNA  Anesthesia Plan Comments:         Anesthesia Quick Evaluation

## 2015-02-07 NOTE — H&P (Signed)
  Primary Care Physician:  PROVIDER NOT IN SYSTEM  Pre-Procedure History & Physical: HPI:  Catherine Fields is a 30 y.o. female is here for an endoscopy.   Past Medical History  Diagnosis Date  . Asthma   . Asthma without status asthmaticus   . Chicken pox   . GERD (gastroesophageal reflux disease)   . Kidney stones   . Sleep apnea   . Hyperkalemia   . RUQ pain     History reviewed. No pertinent past surgical history.  Prior to Admission medications   Medication Sig Start Date End Date Taking? Authorizing Provider  loratadine (CLARITIN) 10 MG tablet Take 10 mg by mouth daily.   Yes Historical Provider, MD  pantoprazole (PROTONIX) 20 MG tablet Take 20 mg by mouth daily.   Yes Historical Provider, MD  dicyclomine (BENTYL) 10 MG capsule Take 10 mg by mouth QID. Reported on 02/07/2015    Historical Provider, MD  Norethindrone Acet-Ethinyl Est (JUNEL 1.5/30) 1.5-30 MG-MCG TABS Take 1 tablet by mouth daily.      Historical Provider, MD    Allergies as of 01/25/2015 - Review Complete 09/10/2014  Allergen Reaction Noted  . Sulfa antibiotics  03/28/2010  . Sulfonamide derivatives  01/03/2010    History reviewed. No pertinent family history.  Social History   Social History  . Marital Status: Married    Spouse Name: N/A  . Number of Children: N/A  . Years of Education: N/A   Occupational History  . Not on file.   Social History Main Topics  . Smoking status: Never Smoker   . Smokeless tobacco: Not on file  . Alcohol Use: No  . Drug Use: Not on file  . Sexual Activity: Not on file   Other Topics Concern  . Not on file   Social History Narrative     Physical Exam: BP 126/62 mmHg  Pulse 99  Temp(Src) 97.6 F (36.4 C) (Tympanic)  Resp 16  Ht 5\' 5"  (1.651 m)  Wt 124.739 kg (275 lb)  BMI 45.76 kg/m2  SpO2 100% General:   Alert,  pleasant and cooperative in NAD Head:  Normocephalic and atraumatic. Neck:  Supple; no masses or thyromegaly. Lungs:  Clear throughout to  auscultation.    Heart:  Regular rate and rhythm. Abdomen:  Soft, nontender and nondistended. Normal bowel sounds, without guarding, and without rebound.   Neurologic:  Alert and  oriented x4;  grossly normal neurologically.  Impression/Plan: Catherine Fields is here for an endoscopy to be performed for RUQ pain  Risks, benefits, limitations, and alternatives regarding  endoscopy have been reviewed with the patient.  Questions have been answered.  All parties agreeable.   Josefine Class, MD  02/07/2015, 3:21 PM

## 2015-02-07 NOTE — Discharge Instructions (Signed)

## 2015-02-07 NOTE — Anesthesia Procedure Notes (Signed)
Date/Time: 02/07/2015 3:15 PM Performed by: Allean Found Pre-anesthesia Checklist: Patient identified, Emergency Drugs available, Suction available, Patient being monitored and Timeout performed Patient Re-evaluated:Patient Re-evaluated prior to inductionOxygen Delivery Method: Nasal cannula Preoxygenation: Pre-oxygenation with 100% oxygen

## 2015-02-08 ENCOUNTER — Encounter: Payer: Self-pay | Admitting: Gastroenterology

## 2015-11-06 IMAGING — US US ABDOMEN COMPLETE
1 series · 14 of 25 positions shown · non-contrast
Comparison: CT 03/07/2011.

CLINICAL DATA: Right upper quadrant pain.

EXAM:
ULTRASOUND ABDOMEN COMPLETE

[Series 1: us abdomen complete · 0.24mm/px · 14 of 109 slices shown]
[im 1/109]
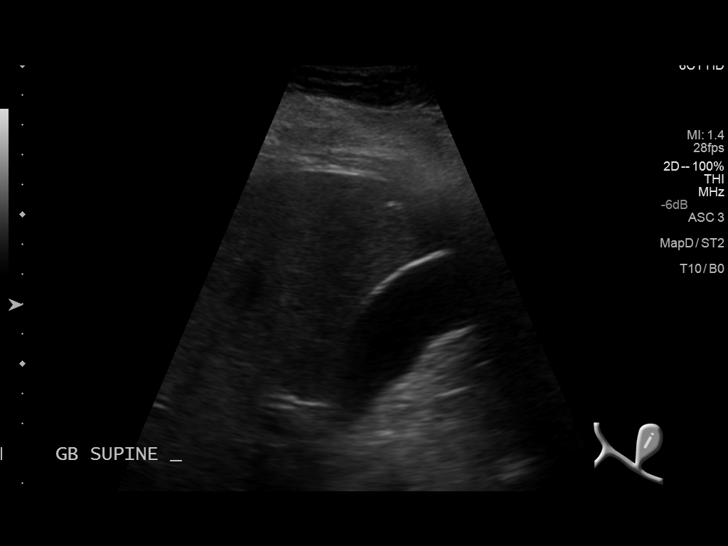
[im 10/109]
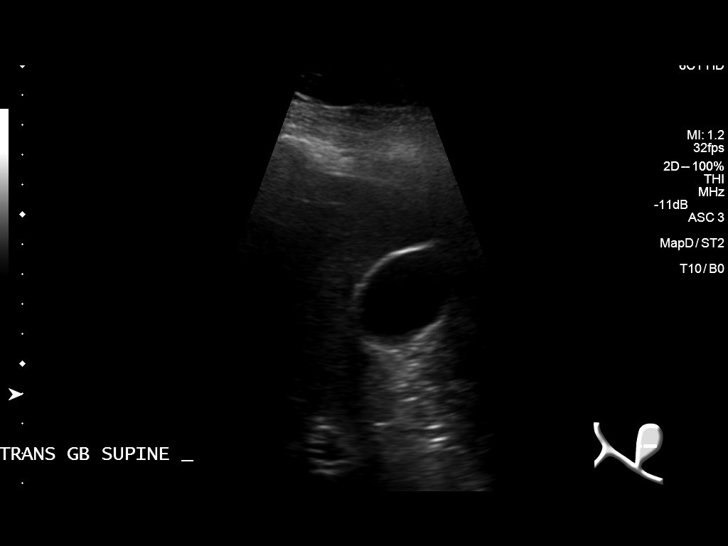
[im 19/109]
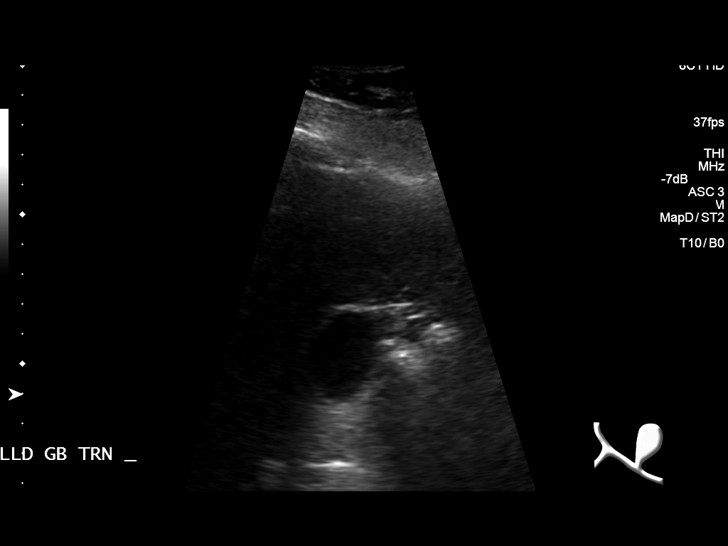
[im 28/109]
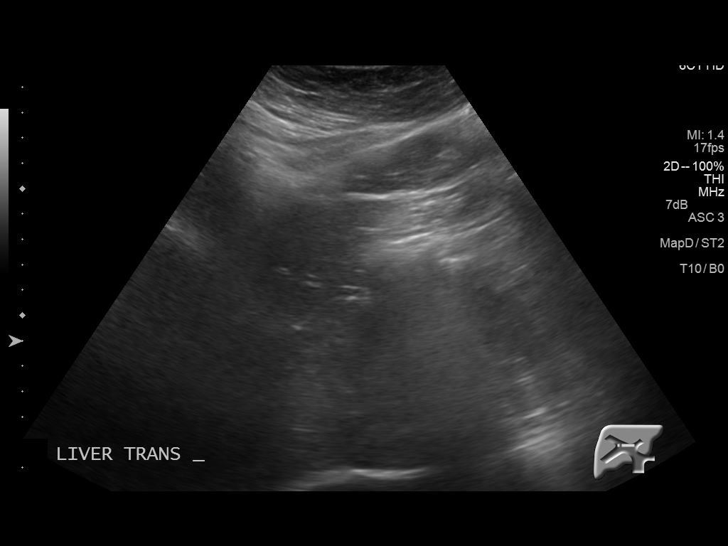
[im 37/109]
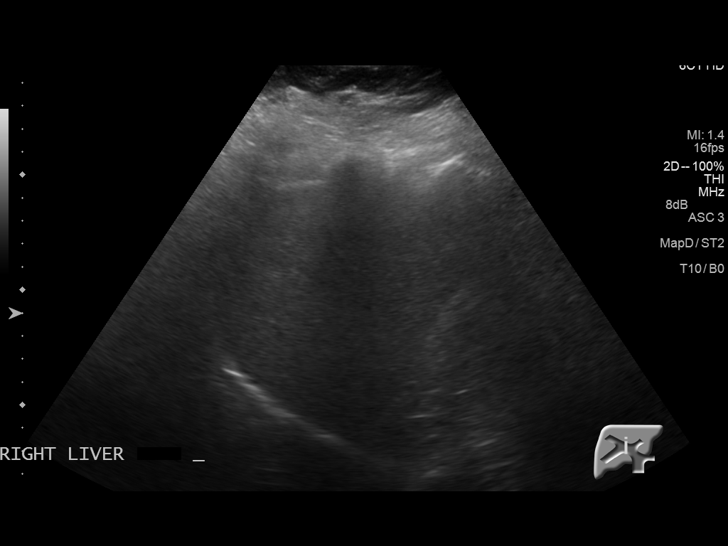
[im 41/109]
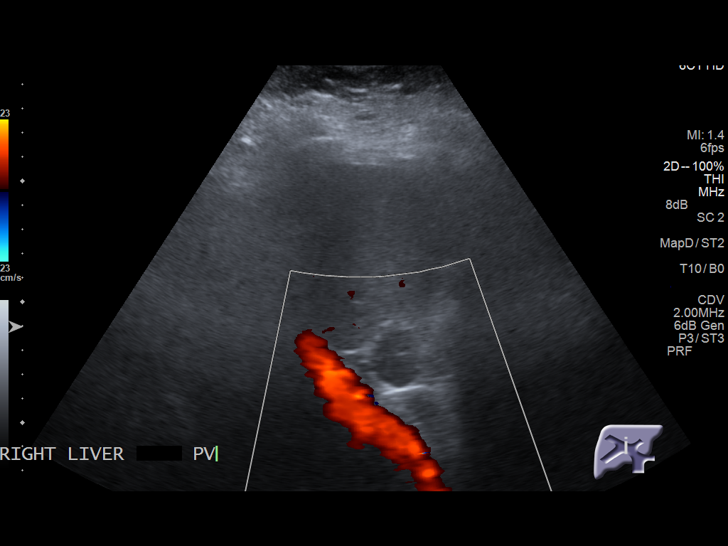
[im 50/109]
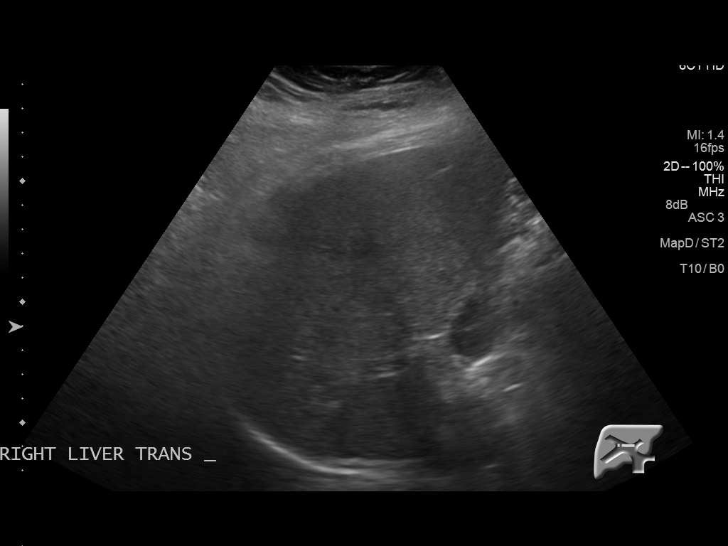
[im 59/109]
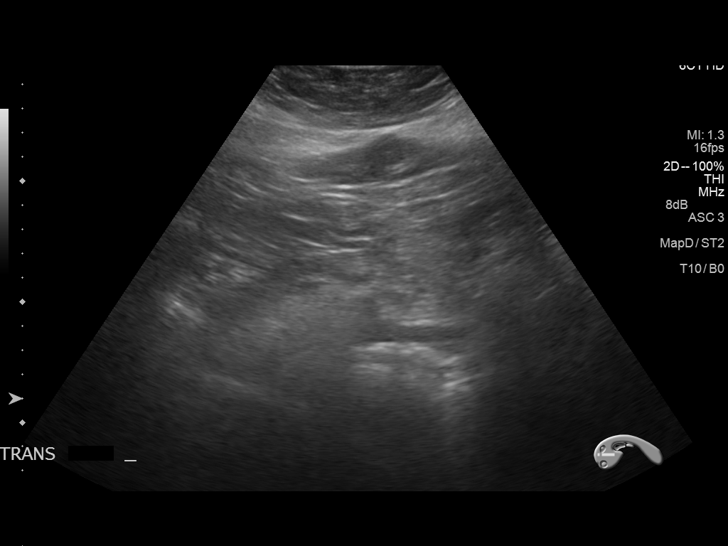
[im 68/109]
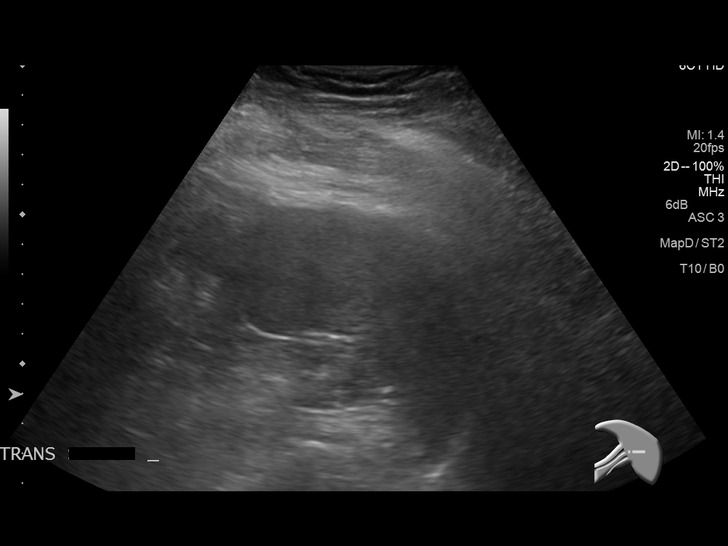
[im 73/109]
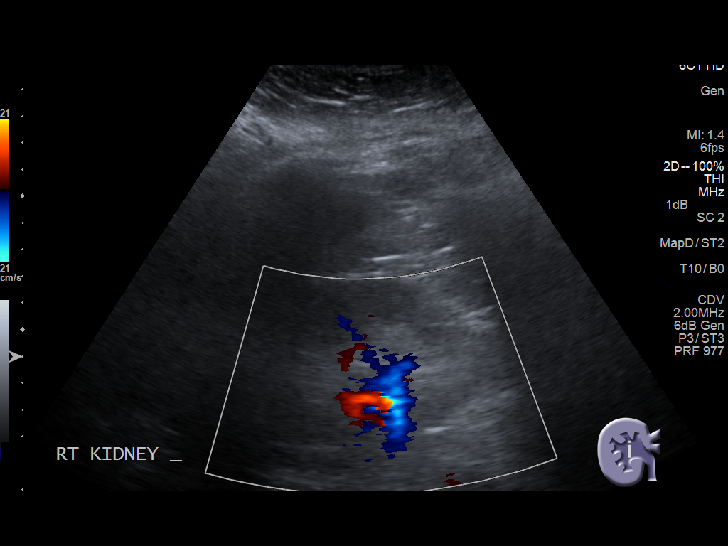
[im 82/109]
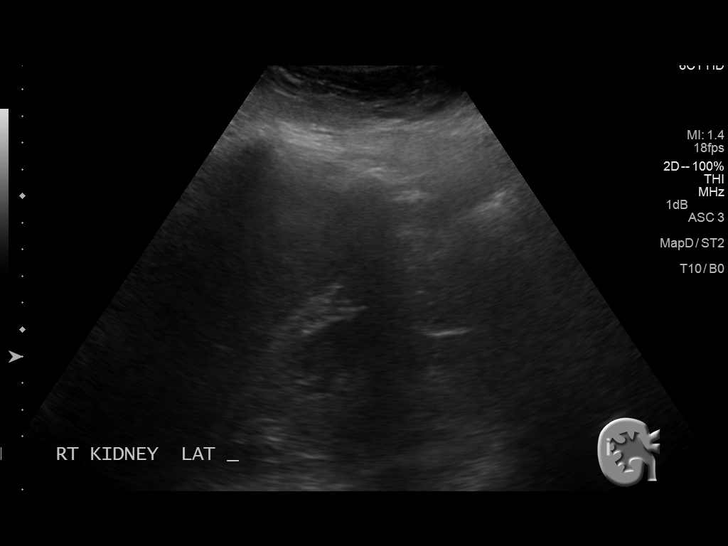
[im 91/109]
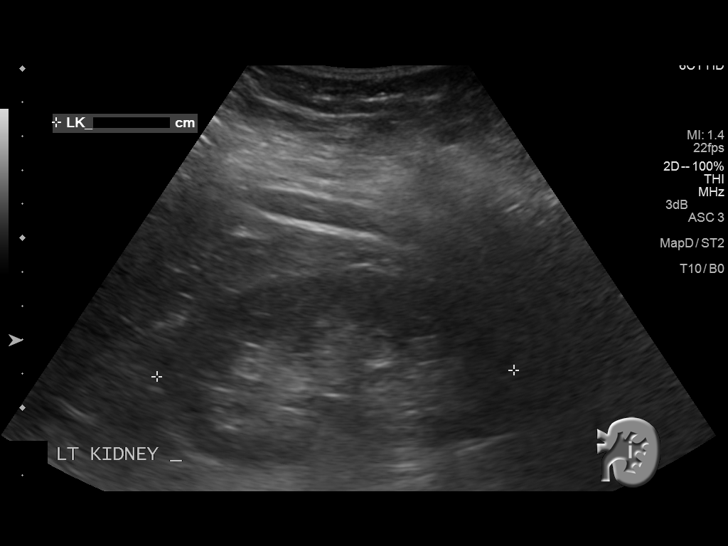
[im 100/109]
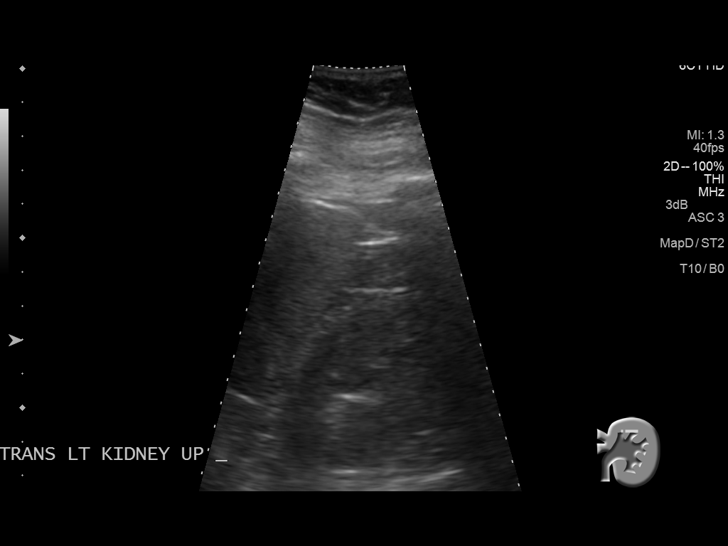
[im 109/109]
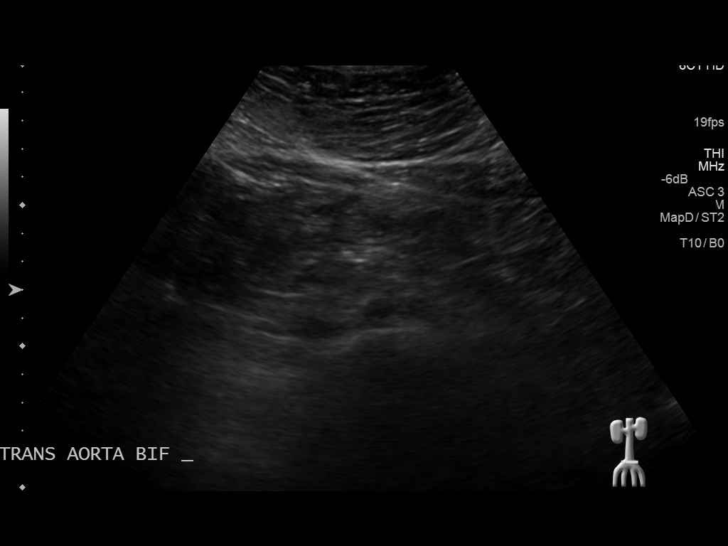

[14 of 25 positions shown; findings below may reference images not displayed]

FINDINGS: Gallbladder: No gallstones or wall thickening visualized. No
sonographic Murphy sign noted.

Common bile duct: Diameter: 1.9 mm

Liver: Liver is echodense consistent fatty infiltration and/or
hepatocellular disease. No focal hepatic abnormality identified.

IVC: No abnormality visualized.

Pancreas: Difficult to visualize due to overlying bowel gas.

Spleen: Size and appearance within normal limits.

Right Kidney: Length: 12.3 cm. Echogenicity within normal limits. No
mass or hydronephrosis visualized.

Left Kidney: Length: 12.0 cm cm. Echogenicity within normal limits.
No mass or hydronephrosis visualized.

Abdominal aorta: No aneurysm visualized.

Other findings: None.
IMPRESSION: Liver is echogenic consistent with fatty infiltration and/or
hepatocellular disease. Exam otherwise unremarkable.

## 2016-01-21 ENCOUNTER — Encounter: Payer: Self-pay | Admitting: *Deleted

## 2016-01-21 DIAGNOSIS — N200 Calculus of kidney: Secondary | ICD-10-CM | POA: Insufficient documentation

## 2016-09-24 ENCOUNTER — Other Ambulatory Visit: Payer: Self-pay

## 2016-09-24 DIAGNOSIS — K219 Gastro-esophageal reflux disease without esophagitis: Secondary | ICD-10-CM | POA: Insufficient documentation

## 2016-09-25 ENCOUNTER — Ambulatory Visit (INDEPENDENT_AMBULATORY_CARE_PROVIDER_SITE_OTHER): Payer: BLUE CROSS/BLUE SHIELD

## 2016-09-25 ENCOUNTER — Ambulatory Visit (INDEPENDENT_AMBULATORY_CARE_PROVIDER_SITE_OTHER): Payer: BLUE CROSS/BLUE SHIELD | Admitting: Podiatry

## 2016-09-25 DIAGNOSIS — M7751 Other enthesopathy of right foot: Secondary | ICD-10-CM | POA: Diagnosis not present

## 2016-09-25 DIAGNOSIS — M79671 Pain in right foot: Secondary | ICD-10-CM

## 2016-09-25 DIAGNOSIS — M21621 Bunionette of right foot: Secondary | ICD-10-CM

## 2016-09-25 NOTE — Progress Notes (Signed)
   Subjective:    Patient ID: Catherine Fields, female    DOB: 1985/06/17, 31 y.o.   MRN: [id number]  HPI     Review of Systems  Constitutional: Positive for unexpected weight change.  Musculoskeletal: Positive for arthralgias.  Skin:       Change in nails  All other systems reviewed and are negative.      Objective:   Physical Exam        Assessment & Plan:

## 2016-09-25 NOTE — Progress Notes (Signed)
   Subjective: Patient is a 31 year old female presenting today as a new patient with complaint of intermittent burning pain to the lateral side of the right foot that began approximately 5 months ago. She reports associated pain to the right fifth toe as well. She states the symptoms began as a sharp, burning sensation in the area while she was working out. She is here for further evaluation and treatment.    Past Medical History:  Diagnosis Date  . Asthma   . Asthma without status asthmaticus   . Chicken pox   . GERD (gastroesophageal reflux disease)   . Hyperkalemia   . Kidney stones   . RUQ pain   . Sleep apnea     Objective: Physical Exam General: The patient is alert and oriented x3 in no acute distress.  Dermatology: Skin is cool, dry and supple bilateral lower extremities. Negative for open lesions or macerations.  Vascular: Palpable pedal pulses bilaterally. No edema or erythema noted. Capillary refill within normal limits.  Neurological: Epicritic and protective threshold grossly intact bilaterally.   Musculoskeletal Exam: Clinical evidence of bunion deformity noted to the respective foot. There is a moderate pain on palpation range of motion of the first MPJ. Lateral deviation of the hallux noted consistent with hallux abductovalgus.  Pain with palpation to the fifth MPJ of the right foot.  Radiographic Exam: Increased intermetatarsal angle greater than 15 with a hallux abductus angle greater than 30 noted on AP view. Moderate degenerative changes noted within the first MPJ.  Assessment: 1. Tailor's Bunion right 2. 5th MPJ capsulitis right   Plan of Care:  1. Patient was evaluated. 2. Informed patient she may need surgery in the future.  3. Injection of 0.5 mLs Celestone Soluspan injected into the 5th MPJ of the right foot. 4. Donut/offloading pads dispensed.  5. Return to clinic in 4 weeks.    Edrick Kins, DPM [facility]  Dr. Edrick Kins, [place]                                        [place], [place] [address]                Office [phone]  Fax [phone]

## 2016-09-26 MED ORDER — BETAMETHASONE SOD PHOS & ACET 6 (3-3) MG/ML IJ SUSP: 3.0000 mg | Freq: Once | INTRAMUSCULAR | Status: DC

## 2016-10-17 ENCOUNTER — Ambulatory Visit: Payer: BLUE CROSS/BLUE SHIELD

## 2016-10-22 ENCOUNTER — Ambulatory Visit: Payer: BLUE CROSS/BLUE SHIELD | Admitting: Medical

## 2016-10-22 ENCOUNTER — Encounter: Payer: Self-pay | Admitting: Medical

## 2016-10-22 VITALS — BP 114/80 | HR 80 | Temp 98.0°F | Resp 16

## 2016-10-22 DIAGNOSIS — J040 Acute laryngitis: Secondary | ICD-10-CM

## 2016-10-22 DIAGNOSIS — J0100 Acute maxillary sinusitis, unspecified: Secondary | ICD-10-CM

## 2016-10-22 MED ORDER — AMOXICILLIN-POT CLAVULANATE 875-125 MG PO TABS: 1.0000 | ORAL_TABLET | Freq: Two times a day (BID) | ORAL | 0 refills | Status: DC

## 2016-10-22 NOTE — Patient Instructions (Signed)
Return to the clinic in 3-5 days if not improving. Contiue Ibuprofen as need for pain take as directed, rest and increase fluids.    Sinusitis, Adult Sinusitis is soreness and inflammation of your sinuses. Sinuses are hollow spaces in the bones around your face. They are located:  Around your eyes.  In the middle of your forehead.  Behind your nose.  In your cheekbones.  Your sinuses and nasal passages are lined with a stringy fluid (mucus). Mucus normally drains out of your sinuses. When your nasal tissues get inflamed or swollen, the mucus can get trapped or blocked so air cannot flow through your sinuses. This lets bacteria, viruses, and funguses grow, and that leads to infection. Follow these instructions at home: Medicines  Take, use, or apply over-the-counter and prescription medicines only as told by your doctor. These may include nasal sprays.  If you were prescribed an antibiotic medicine, take it as told by your doctor. Do not stop taking the antibiotic even if you start to feel better. Hydrate and Humidify  Drink enough water to keep your pee (urine) clear or pale yellow.  Use a cool mist humidifier to keep the humidity level in your home above 50%.  Breathe in steam for 10-15 minutes, 3-4 times a day or as told by your doctor. You can do this in the bathroom while a hot shower is running.  Try not to spend time in cool or dry air. Rest  Rest as much as possible.  Sleep with your head raised (elevated).  Make sure to get enough sleep each night. General instructions  Put a warm, moist washcloth on your face 3-4 times a day or as told by your doctor. This will help with discomfort.  Wash your hands often with soap and water. If there is no soap and water, use hand sanitizer.  Do not smoke. Avoid being around people who are smoking (secondhand smoke).  Keep all follow-up visits as told by your doctor. This is important. Contact a doctor if:  You have a  fever.  Your symptoms get worse.  Your symptoms do not get better within 10 days. Get help right away if:  You have a very bad headache.  You cannot stop throwing up (vomiting).  You have pain or swelling around your face or eyes.  You have trouble seeing.  You feel confused.  Your neck is stiff.  You have trouble breathing. This information is not intended to replace advice given to you by your health care provider. Make sure you discuss any questions you have with your health care provider. Document Released: 06/06/2007 Document Revised: 08/14/2015 Document Reviewed: 10/13/2014 Elsevier Interactive Patient Education  2018 [employer]. Laryngitis Laryngitis is swelling (inflammation) of your vocal cords. This causes hoarseness, coughing, loss of voice, sore throat, or a dry throat. When your vocal cords are inflamed, your voice sounds different. Laryngitis can be temporary (acute) or long-term (chronic). Most cases of acute laryngitis improve with time. Chronic laryngitis is laryngitis that lasts for more than three weeks. Follow these instructions at home:  Drink enough fluid to keep your pee (urine) clear or pale yellow.  Breathe in moist air. Use a humidifier if you live in a dry climate.  Take medicines only as told by your doctor.  Do not smoke cigarettes or electronic cigarettes. If you need help quitting, ask your doctor.  Talk as little as possible. Also avoid whispering, which can cause vocal strain.  Write instead of talking. Do  this until your voice is back to normal. Contact a doctor if:  You have a fever.  Your pain is worse.  You have trouble swallowing. Get help right away if:  You cough up blood.  You have trouble breathing. This information is not intended to replace advice given to you by your health care provider. Make sure you discuss any questions you have with your health care provider. Document Released: 12/07/2010 Document Revised:  05/26/2015 Document Reviewed: 06/02/2013 Elsevier Interactive Patient Education  [employer].

## 2016-10-22 NOTE — Progress Notes (Signed)
   Subjective:    Patient ID: Catherine Fields, female    DOB: 12-May-1985, 31 y.o.   MRN: [id number]  HPI  31 yo female in non acute distress comes in today with green and white post nasal drip  Started  On  10/17/16,, clear discharge out of the nose. Mild cough, productive at times, green and white also with dots of blood on Saturday now no longer with blood. No longer on allergy injections. Denies fevers or chills.  Review of Systems  Constitutional: Negative for chills, fatigue and fever.  HENT: Positive for congestion, ear pain, postnasal drip, rhinorrhea, sore throat (wed and thursday , but better today) and voice change. Negative for hearing loss, nosebleeds, sinus pain, sinus pressure, sneezing, tinnitus and trouble swallowing.   Eyes: Negative for discharge and itching.  Respiratory: Positive for cough. Negative for shortness of breath.   Cardiovascular: Negative for chest pain.  Gastrointestinal: Negative for abdominal pain.  Endocrine: Positive for cold intolerance. Negative for heat intolerance.  Genitourinary: Negative for dysuria.  Musculoskeletal: Negative for back pain and myalgias.  Skin: Negative for rash.  Allergic/Immunologic: Positive for environmental allergies. Negative for food allergies.  Neurological: Negative for dizziness, syncope and light-headedness.  Hematological: Positive for adenopathy.  Psychiatric/Behavioral: Negative for behavioral problems, self-injury and suicidal ideas. The patient is not nervous/anxious.        Objective:   Physical Exam  Constitutional: She is oriented to person, place, and time. She appears well-developed and well-nourished.  HENT:  Head: Normocephalic and atraumatic.  Right Ear: Hearing, tympanic membrane, external ear and ear canal normal.  Left Ear: Hearing, tympanic membrane, external ear and ear canal normal.  Nose: Mucosal edema present.  Mouth/Throat: Uvula is midline, oropharynx is clear and moist and mucous membranes  are normal. No oropharyngeal exudate, posterior oropharyngeal edema, posterior oropharyngeal erythema or tonsillar abscesses.  Eyes: Pupils are equal, round, and reactive to light. Conjunctivae and EOM are normal.  Neck: Normal range of motion. Neck supple.  Cardiovascular: Normal rate, regular rhythm and normal heart sounds.   Pulmonary/Chest: Effort normal and breath sounds normal.  Lymphadenopathy:    She has cervical adenopathy.  Neurological: She is alert and oriented to person, place, and time.  Skin: Skin is warm and dry.  Psychiatric: She has a normal mood and affect. Her behavior is normal. Judgment and thought content normal.  Nursing note and vitals reviewed.         Assessment & Plan:  Sinusitis maxillary Laryngitis minimize talking and dilute salt water gargles 3 times a day. Meds ordered this encounter  Medications  . amoxicillin-clavulanate (AUGMENTIN) 875-125 MG tablet    Sig: Take 1 tablet by mouth 2 (two) times daily.    Dispense:  20 tablet    Refill:  0   Had Steroid injection in right  foot  2 weeks ago in the 5th metatarsal distally ( taylor's bunion). Return to clinic in 3-5 days if not improving. Patient verbalizes understanding and has no questions at discharge.

## 2016-10-23 ENCOUNTER — Ambulatory Visit: Payer: BLUE CROSS/BLUE SHIELD | Admitting: Podiatry

## 2017-01-08 ENCOUNTER — Ambulatory Visit: Payer: BLUE CROSS/BLUE SHIELD | Admitting: Medical

## 2017-01-08 VITALS — BP 112/60 | HR 98 | Temp 98.7°F | Resp 18 | Wt 266.8 lb

## 2017-01-08 DIAGNOSIS — J0100 Acute maxillary sinusitis, unspecified: Secondary | ICD-10-CM

## 2017-01-08 MED ORDER — AZITHROMYCIN 250 MG PO TABS
ORAL_TABLET | ORAL | 0 refills | Status: DC
Start: 2017-01-08 — End: 2017-04-17

## 2017-01-08 NOTE — Progress Notes (Signed)
   Subjective:    Patient ID: Catherine Fields, female    DOB: 10/12/1985, 32 y.o.   MRN: [id number]  HPI 32 yo female in non acute distress presents to day with symptoms of nasal congestion , with green discharge out of nares. Sore throat this weekend, today better. Hoarse voice. Taking Claritin-D . Denies fever or chills. Maxillary and forehead facial pain and top teeth hurt " My usual" per patient.   Review of Systems  Constitutional: Negative for activity change, appetite change, chills, fatigue and fever.  HENT: Positive for congestion, postnasal drip, rhinorrhea, sinus pressure, sinus pain, sore throat (now better) and voice change. Negative for ear pain and sneezing.   Eyes: Negative for discharge and itching.  Respiratory: Negative for cough and shortness of breath.   Cardiovascular: Negative for chest pain.  Gastrointestinal: Negative for abdominal pain.  Endocrine: Negative for polydipsia, polyphagia and polyuria.  Genitourinary: Negative for dysuria.  Musculoskeletal: Negative for myalgias.  Skin: Negative for rash.  Allergic/Immunologic: Positive for environmental allergies. Negative for food allergies.  Neurological: Positive for headaches (after looking at computer all day, nothing new. supposed to be wearing glasses but does not.). Negative for dizziness, syncope and light-headedness.  Hematological: Positive for adenopathy.  Psychiatric/Behavioral: Negative for behavioral problems, self-injury and suicidal ideas. The patient is not nervous/anxious.        Objective:   Physical Exam  Constitutional: She appears well-developed and well-nourished.  HENT:  Head: Normocephalic and atraumatic.  Right Ear: Hearing, tympanic membrane, external ear and ear canal normal.  Left Ear: Hearing, tympanic membrane, external ear and ear canal normal.  Nose: Mucosal edema and rhinorrhea present. Right sinus exhibits maxillary sinus tenderness. Left sinus exhibits maxillary sinus  tenderness.  Mouth/Throat: Uvula is midline. Posterior oropharyngeal erythema present.  Eyes: Conjunctivae and EOM are normal. Pupils are equal, round, and reactive to light.  Neck: Normal range of motion. Neck supple.  Cardiovascular: Normal rate, regular rhythm and normal heart sounds.  Pulmonary/Chest: Effort normal and breath sounds normal.  Lymphadenopathy:    She has cervical adenopathy.  Neurological: She is alert.  Skin: Skin is warm and dry.  Psychiatric: She has a normal mood and affect. Her behavior is normal. Judgment and thought content normal.  Nursing note and vitals reviewed.   Mild posterior pharynx erythema Turbinate erythema L>R      Assessment & Plan:  Sinusitis Meds ordered this encounter  Medications  . azithromycin (ZITHROMAX) 250 MG tablet    Sig: Take 2 tablets by mouth today then one tablet days 2-5 , take with food.    Dispense:  6 tablet    Refill:  0  Return in 3-5 days if not improving.  Patient verbalizes understanding and has no questions at discharge.

## 2017-01-08 NOTE — Patient Instructions (Signed)

## 2017-01-09 ENCOUNTER — Ambulatory Visit: Payer: BLUE CROSS/BLUE SHIELD | Admitting: Medical

## 2017-04-17 ENCOUNTER — Ambulatory Visit: Payer: BLUE CROSS/BLUE SHIELD | Admitting: Adult Health

## 2017-04-17 ENCOUNTER — Encounter: Payer: Self-pay | Admitting: Adult Health

## 2017-04-17 VITALS — BP 131/70 | HR 88 | Temp 98.2°F | Resp 16 | Ht 65.0 in | Wt 254.0 lb

## 2017-04-17 DIAGNOSIS — H6690 Otitis media, unspecified, unspecified ear: Secondary | ICD-10-CM | POA: Insufficient documentation

## 2017-04-17 DIAGNOSIS — H6993 Unspecified Eustachian tube disorder, bilateral: Secondary | ICD-10-CM | POA: Insufficient documentation

## 2017-04-17 DIAGNOSIS — H6983 Other specified disorders of Eustachian tube, bilateral: Secondary | ICD-10-CM | POA: Insufficient documentation

## 2017-04-17 MED ORDER — PREDNISONE 10 MG (21) PO TBPK: ORAL_TABLET | ORAL | 0 refills | Status: DC

## 2017-04-17 MED ORDER — FLUTICASONE PROPIONATE 50 MCG/ACT NA SUSP: 2.0000 | Freq: Every day | NASAL | 3 refills | Status: DC

## 2017-04-17 MED ORDER — AMOXICILLIN-POT CLAVULANATE 875-125 MG PO TABS: 1.0000 | ORAL_TABLET | Freq: Two times a day (BID) | ORAL | 0 refills | Status: DC

## 2017-04-17 NOTE — Patient Instructions (Addendum)
See ENT within two months. Start OTC Antihistamine.   Otitis Media, Adult Otitis media is redness, soreness, and puffiness (swelling) in the space just behind your eardrum (middle ear). It may be caused by allergies or infection. It often happens along with a cold. Follow these instructions at home:  Take your medicine as told. Finish it even if you start to feel better.  Only take over-the-counter or prescription medicines for pain, discomfort, or fever as told by your doctor.  Follow up with your doctor as told. Contact a doctor if:  You have otitis media only in one ear, or bleeding from your nose, or both.  You notice a lump on your neck.  You are not getting better in 3-5 days.  You feel worse instead of better. Get help right away if:  You have pain that is not helped with medicine.  You have puffiness, redness, or pain around your ear.  You get a stiff neck.  You cannot move part of your face (paralysis).  You notice that the bone behind your ear hurts when you touch it. This information is not intended to replace advice given to you by your health care provider. Make sure you discuss any questions you have with your health care provider. Document Released: 06/06/2007 Document Revised: 05/26/2015 Document Reviewed: 07/15/2012 Elsevier Interactive Patient Education  2017 [employer].

## 2017-04-17 NOTE — Progress Notes (Signed)
Subjective:     Patient ID: Catherine Fields, female   DOB: 22-May-1985, 32 y.o.   MRN: [id number]  HPI Patient is a 32 year old female in no acute distress with ear pain right side, chest congestion, nasal congestion x 2 weeks. Chest congestion started yesterday per patient.  She started Mucinex over the counter.  She is not on any allergy medications currently. She took Claritin D x 2 days this week with mild improvement.   Patient's last menstrual period was 04/03/2017.  She reports seeing ENT for allergy shots and was taking shots from 2014- 2017. She has seen Dr . Virgia Land. She stopped allergy shots. She reports having about four sinus infections per year. Her allergies are not controlled. She has not seen ENT since 2017. Allergies  Allergen Reactions  . Ciprofloxacin     Calf pain and muscle aches  . Sulfonamide Derivatives   . Sulfa Antibiotics Rash   No current outpatient medications on file.   Patient Active Problem List   Diagnosis Date Noted  . GERD (gastroesophageal reflux disease) 09/24/2016  . Kidney stones 01/21/2016  . Mild persistent asthma 09/15/2013  . Foot pain 03/28/2010  . SHOULDER IMPINGEMENT SYNDROME, LEFT 01/03/2010  . OBESITY, UNSPECIFIED 06/14/2009  . 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   HENT: Positive for congestion, ear pain, postnasal drip, rhinorrhea and sneezing. Negative for dental problem, drooling, ear discharge, facial swelling, hearing loss, mouth sores, nosebleeds, sinus pressure, sinus pain, sore throat, tinnitus, trouble swallowing and voice change.   Eyes: Negative.   Respiratory: Positive for cough. Negative for apnea, choking, chest tightness, shortness of breath, wheezing and stridor.   Cardiovascular: Negative.   Gastrointestinal: Negative.   Endocrine: Negative.   Genitourinary: Negative.   Musculoskeletal: Negative.   Skin: Negative.   Allergic/Immunologic:  Positive for environmental allergies (tree and grass pollen per testing ).  Neurological: Negative.   Hematological: Negative.   Psychiatric/Behavioral: Negative.        Objective:   Physical Exam  Constitutional: She is oriented to person, place, and time. Vital signs are normal. She appears well-developed and well-nourished. She is active.  Non-toxic appearance. She does not have a sickly appearance. She does not appear ill. No distress. She is not intubated.  Patient is alert and oriented and responsive to questions Engages in eye contact with provider. Speaks in full sentences without any pauses without any shortness of breath.    HENT:  Head: Normocephalic and atraumatic.  Right Ear: Hearing, external ear and ear canal normal. No tenderness. Tympanic membrane is erythematous. A middle ear effusion is present.  Left Ear: Hearing, external ear and ear canal normal. No tenderness. Tympanic membrane is erythematous. A middle ear effusion is present.  Nose: Mucosal edema and rhinorrhea present. Right sinus exhibits no maxillary sinus tenderness and no frontal sinus tenderness. Left sinus exhibits no maxillary sinus tenderness and no frontal sinus tenderness.  Mouth/Throat: Uvula is midline and mucous membranes are normal. Posterior oropharyngeal erythema present. No oropharyngeal exudate, posterior oropharyngeal edema or tonsillar abscesses.  Eyes: Pupils are equal, round, and reactive to light. Conjunctivae, EOM and lids are normal. Lids are everted and swept, no foreign bodies found. Right eye exhibits no discharge. Left eye exhibits no discharge. No scleral icterus.  Neck: Trachea normal, normal range of motion, full passive range of motion without pain and phonation normal. Neck supple. No JVD present. No tracheal tenderness present.  No tracheal deviation present.  Cardiovascular: Normal rate, regular rhythm, normal heart sounds and intact distal pulses. Exam reveals no gallop and no friction  rub.  No murmur heard. Pulmonary/Chest: Effort normal and breath sounds normal. No accessory muscle usage or stridor. No apnea, no tachypnea and no bradypnea. She is not intubated. No respiratory distress. She has no wheezes. She has no rales. She exhibits no tenderness.  Abdominal: Soft. Bowel sounds are normal.  Musculoskeletal: Normal range of motion.  Patient moves on and off of exam table and in room without difficulty. Gait is normal in hall and in room. Patient is oriented to person place time and situation. Patient answers questions appropriately and engages in conversation.   Lymphadenopathy:       Head (right side): No submental, no submandibular, no tonsillar, no preauricular, no posterior auricular and no occipital adenopathy present.       Head (left side): No submental, no submandibular, no tonsillar, no preauricular, no posterior auricular and no occipital adenopathy present.    She has no cervical adenopathy.  Neurological: She is alert and oriented to person, place, and time. She has normal strength and normal reflexes. She displays normal reflexes. No cranial nerve deficit or sensory deficit. She exhibits normal muscle tone. She displays a negative Romberg sign. Coordination and gait normal.  Skin: Skin is warm, dry and intact. No rash noted. She is not diaphoretic. No cyanosis or erythema. No pallor. Nails show no clubbing.  Psychiatric: She has a normal mood and affect. Her speech is normal and behavior is normal. Judgment and thought content normal. Cognition and memory are normal.  Vitals reviewed.      Assessment:     Acute otitis media, unspecified otitis media type  Eustachian tube dysfunction, bilateral        Plan:      Current Outpatient Medications:  .  amoxicillin-clavulanate (AUGMENTIN) 875-125 MG tablet, Take 1 tablet by mouth 2 (two) times daily., Disp: 20 tablet, Rfl: 0 .  fluticasone (FLONASE) 50 MCG/ACT nasal spray, Place 2 sprays into both  nostrils daily., Disp: 16 g, Rfl: 3 .  predniSONE (STERAPRED UNI-PAK 21 TAB) 10 MG (21) TBPK tablet, By mouth Take 6 tablets on day 1, Take 5 tablets day 2 Take 4 tablets day 3 Take 3 tablets day 4 Take 2 tablets day five 5 Take 1 tablet day, Disp: 21 tablet, Rfl: 0  Provider also recommends patient see primary care physician for a routine physical and to establish primary care. Patient may chose provider of choice. Also gave the [place] [facility] at [phone] or web site at [email or web address] to help assist with finding a primary care doctor. Patient understands this office is acute care and no longer taking new primary care patients.  Patient declines refferal to Dr. Jonette Eva reports she is still a patinet and will call him for evaluation as soon as possible advise to be seen no later than two months from today and sooner if symptoms return or worsen at anytime. Discussed etiologies of repeat infections and repeat antibiotic use. Discussed allergies and control needed. Discussed rare but possible malignancy of sinuses and need for evaluation by Ear Nose Throat Specialist     Advised patient call the office or your primary care doctor for an appointment if no improvement within 72 hours or if any symptoms change or worsen at any time  Advised ER or urgent Care if after hours or on weekend. Call 911  for emergency symptoms at any time.Patinet verbalized understanding of all instructions given/reviewed and treatment plan and has no further questions or concerns at this time.    Patient verbalized understanding of all instructions given and denies any further questions at this time.

## 2017-05-09 ENCOUNTER — Ambulatory Visit: Payer: BLUE CROSS/BLUE SHIELD | Admitting: Adult Health

## 2017-05-09 ENCOUNTER — Encounter: Payer: Self-pay | Admitting: Adult Health

## 2017-05-09 VITALS — BP 136/82 | HR 86 | Temp 97.9°F | Resp 16 | Ht 66.0 in | Wt 262.0 lb

## 2017-05-09 DIAGNOSIS — R509 Fever, unspecified: Secondary | ICD-10-CM

## 2017-05-09 LAB — POCT INFLUENZA A/B
INFLUENZA A, POC: NEGATIVE
INFLUENZA B, POC: NEGATIVE

## 2017-05-09 MED ORDER — DOXYCYCLINE HYCLATE 100 MG PO TABS: 100.0000 mg | ORAL_TABLET | Freq: Two times a day (BID) | ORAL | 0 refills | Status: DC

## 2017-05-09 NOTE — Addendum Note (Signed)
Addended by: Doreen Beam on: 05/09/2017 02:18 PM   Modules accepted: Orders

## 2017-05-09 NOTE — Progress Notes (Addendum)
Subjective:     Patient ID: Catherine Fields, female   DOB: 02-Aug-1985, 32 y.o.   MRN: [id number]  HPI Blood pressure 136/82, pulse 86, temperature 97.9 F (36.6 C), resp. rate 16, height 5\' 6"  (1.676 m), weight 262 lb (118.8 kg), last menstrual period 04/23/2017, SpO2 99 %.  Patient is a 32 year old female in no acute distress who comes to the clinic with fever since Saturday 05/04/17 - she has mild generalized fatigue and body aches,   She has been doing many [place] and could have had tick exposure - but no known exposures. No removal of known tick. She does report her friend is being treated for Lyme's that was in the same field.   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 Denies any wounds, urinary symptoms or respiratory symptoms.  She denies any pain. She did get a sunburn at the beach on her back last week, healed.   Patient  Denies any rash, chest pain, shortness of breath, nausea, vomiting, or diarrhea.   Review of Systems  Constitutional: Positive for fatigue and fever. Negative for activity change, appetite change, chills, diaphoresis and unexpected weight change.  HENT: Negative.   Respiratory: Negative.   Cardiovascular: Negative.   Gastrointestinal: Negative.   Endocrine: Negative.   Genitourinary: Negative.   Musculoskeletal: Positive for arthralgias. Negative for back pain, gait problem, joint swelling, myalgias, neck pain and neck stiffness.  Skin: Negative.   Allergic/Immunologic:        -- Ciprofloxacin    --  Calf pain and muscle aches  -- Sulfonamide Derivatives   -- Sulfa Antibiotics -- Rash   Neurological: Negative.   Hematological: Negative.   Psychiatric/Behavioral: Negative.        Objective:   Physical Exam  Constitutional: She is oriented to person, place, and time. Vital signs are normal. She appears well-developed and well-nourished. She is active. No distress.  Patient is alert and oriented  and responsive to questions Engages in eye contact with provider. Speaks in full sentences without any pauses without any shortness of breath.    HENT:  Head: Normocephalic and atraumatic.  Right Ear: Hearing, external ear and ear canal normal. No tenderness. Tympanic membrane is not perforated. A middle ear effusion is present.  Left Ear: Hearing, external ear and ear canal normal. No tenderness. Tympanic membrane is not perforated. A middle ear effusion is present.  Nose: Nose normal. No mucosal edema or rhinorrhea. Right sinus exhibits no maxillary sinus tenderness and no frontal sinus tenderness. Left sinus exhibits no maxillary sinus tenderness and no frontal sinus tenderness.  Mouth/Throat: Uvula is midline, oropharynx is clear and moist and mucous membranes are normal. No oropharyngeal exudate.  Eyes: Pupils are equal, round, and reactive to light. Conjunctivae, EOM and lids are normal. Right eye exhibits no discharge. Left eye exhibits no discharge. No scleral icterus.  Neck: Normal range of motion. Neck supple. No JVD present. No tracheal deviation present.  Cardiovascular: Normal rate, regular rhythm, normal heart sounds, intact distal pulses and normal pulses. Exam reveals no gallop and no friction rub.  No murmur heard. Pulmonary/Chest: Effort normal and breath sounds normal. No accessory muscle usage or stridor. No apnea, no tachypnea and no bradypnea. No respiratory distress. She has no wheezes. She has no rales. She exhibits no tenderness.  Abdominal: Soft. Normal appearance, normal aorta and bowel sounds are normal. She exhibits no distension, no abdominal bruit, no ascites and no  mass. There is no tenderness. There is no rebound, no guarding, no CVA tenderness and negative Murphy's sign. No hernia.  Musculoskeletal: Normal range of motion.  Patient moves on and off of exam table and in room without difficulty. Gait is normal in hall and in room. Patient is oriented to person place  time and situation. Patient answers questions appropriately and engages in conversation.   Lymphadenopathy:       Head (right side): No submental, no submandibular, no tonsillar, no preauricular, no posterior auricular and no occipital adenopathy present.       Head (left side): No submental, no submandibular, no tonsillar, no preauricular, no posterior auricular and no occipital adenopathy present.    She has no cervical adenopathy.    She has no axillary adenopathy.  Neurological: She is alert and oriented to person, place, and time. She displays normal reflexes. No cranial nerve deficit or sensory deficit. She exhibits normal muscle tone. Coordination normal. GCS eye subscore is 4. GCS verbal subscore is 5. GCS motor subscore is 6.  Skin: Skin is warm, dry and intact. Capillary refill takes less than 2 seconds. No rash noted. She is not diaphoretic. No cyanosis or erythema. No pallor. Nails show no clubbing.  Psychiatric: She has a normal mood and affect. Her speech is normal and behavior is normal. Judgment and thought content normal. Cognition and memory are normal.  Vitals reviewed.      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     FLU A & B negative POCT.  Plan:     Meds ordered this encounter  Medications  . doxycycline (VIBRA-TABS) 100 MG tablet    Sig: Take 1 tablet (100 mg total) by mouth 2 (two) times daily.    Dispense:  28 tablet    Refill:  0   Declines lab work at this time will return if symptoms worsen or change.    Return to the office in two weeks- sooner if needed and or any symptoms change, persist or worsen.    Provider thoroughly discussed in collaboration above plan with supervising physician Dr. Miguel Aschoff who is in agreement with the care plan as above.   Advised patient call the office or your primary care doctor for an appointment if no improvement  within 72 hours or if any symptoms change or worsen at any time  Advised ER or urgent Care if after hours or on weekend. Call 911 for emergency symptoms at any time.Patinet verbalized understanding of all instructions given/reviewed and treatment plan and has no further questions or concerns at this time.    Patient verbalized understanding of all instructions given and denies any further questions at this time.

## 2017-05-09 NOTE — Patient Instructions (Signed)
Doxycycline oral tablets What is this medicine? DOXYCYCLINE (dox i SYE kleen) is a tetracycline antibiotic. It kills certain bacteria or stops their growth. This medicine is used to treat a dental infection called periodontitis. This medicine may be used for other purposes; ask your health care provider or pharmacist if you have questions. COMMON BRAND NAME(S): Oraxyl, Periostat What should I tell my health care provider before I take this medicine? They need to know if you have any of these conditions: -liver disease -long exposure to sunlight like working outdoors -stomach problems like colitis -an unusual or allergic reaction to doxycycline, tetracycline antibiotics, other medicines, foods, dyes, or preservatives -pregnant or trying to get pregnant -breast-feeding How should I use this medicine? Take this medicine by mouth with a full glass of water. Follow the directions on the prescription label. Take this medicine at least 1 hour before or 2 hours after food. Take your medicine at regular intervals. Do not take your medicine more often than directed. Take all of your medicine as directed even if you think you are better. Do not skip doses or stop your medicine early. Talk to your pediatrician regarding the use of this medicine in children. Special care may be needed. Overdosage: If you think you have taken too much of this medicine contact a poison control center or emergency room at once. NOTE: This medicine is only for you. Do not share this medicine with others. What if I miss a dose? If you miss a dose, take it as soon as you can. If it is almost time for your next dose, take only that dose. Do not take double or extra doses. What may interact with this medicine? -antacids -barbiturates -birth control pills -bismuth subsalicylate -carbamazepine -methoxyflurane -other antibiotics -phenytoin -vitamins that contain iron -warfarin This list may not describe all possible  interactions. Give your health care provider a list of all the medicines, herbs, non-prescription drugs, or dietary supplements you use. Also tell them if you smoke, drink alcohol, or use illegal drugs. Some items may interact with your medicine. What should I watch for while using this medicine? Tell your doctor or health care professional if your symptoms do not improve. Do not treat diarrhea with over the counter products. Contact your doctor if you have diarrhea that lasts more than 2 days or if it is severe and watery. Do not take this medicine just before going to bed. It may not dissolve properly when you lay down and can cause pain in your throat. Drink plenty of fluids while taking this medicine to also help reduce irritation in your throat. This medicine can make you more sensitive to the sun. Keep out of the sun. If you cannot avoid being in the sun, wear protective clothing and use sunscreen. Do not use sun lamps or tanning beds/booths. Birth control pills may not work properly while you are taking this medicine. Talk to your doctor about using an extra method of birth control. If you are being treated for a sexually transmitted infection, avoid sexual contact until you have finished your treatment. Your sexual partner may also need treatment. Avoid antacids, aluminum, calcium, magnesium, and iron products for 4 hours before and 2 hours after taking a dose of this medicine. What side effects may I notice from receiving this medicine? Side effects that you should report to your doctor or health care professional as soon as possible: -allergic reactions like skin rash, itching or hives, swelling of the face, lips, or tongue -difficulty  breathing -fever -itching in the rectal or genital area -pain on swallowing -redness, blistering, peeling or loosening of the skin, including inside the mouth -severe stomach pain or cramps -unusual bleeding or bruising -unusually weak or tired -yellowing  of the eyes or skin Side effects that usually do not require medical attention (report to your doctor or health care professional if they continue or are bothersome): -diarrhea -loss of appetite -nausea, vomiting This list may not describe all possible side effects. Call your doctor for medical advice about side effects. You may report side effects to FDA at 1-800-FDA-1088. Where should I keep my medicine? Keep out of the reach of children. Store at room temperature between 15 and 30 degrees C (59 and 86 degrees F). Protect from light. Keep container tightly closed. Throw away any unused medicine after the expiration date. Taking this medicine after the expiration date can make you seriously ill. NOTE: This sheet is a summary. It may not cover all possible information. If you have questions about this medicine, talk to your doctor, pharmacist, or health care provider.  2018 Elsevier/Gold Standard (2007-04-17 14:50:34) Tick Bite Information, Adult Ticks are insects that can bite. Most ticks live in shrubs and grassy areas. They climb onto people and animals that go by. Then they bite. Some ticks carry germs that can make you sick. How can I prevent tick bites?  Use an insect repellent that has 20% or higher of the ingredients DEET, picaridin, or IR3535. Put this insect repellent on: ? Bare skin. ? The tops of your boots. ? Your pant legs. ? The ends of your sleeves.  If you use an insect repellent that has the ingredient permethrin, make sure to follow the instructions on the bottle. Treat the following: ? Clothing. ? Supplies. ? Boots. ? Tents.  Wear long sleeves, long pants, and light colors.  Tuck your pant legs into your socks.  Stay in the middle of the trail.  Try not to walk through long grass.  Before going inside your house, check your clothes, hair, and skin for ticks. Make sure to check your head, neck, armpits, waist, groin, and joint areas.  Check for ticks every  day.  When you come indoors: ? Wash your clothes right away. ? Shower right away. ? Dry your clothes in a dryer on high heat for 60 minutes or more. What is the right way to remove a tick? Remove a tick from your skin as soon as possible.  To remove a tick that is crawling on your skin: ? Go outdoors and brush the tick off. ? Use tape or a lint roller.  To remove a tick that is biting: ? Wash your hands. ? If you have latex gloves, put them on. ? Use tweezers, curved forceps, or a tick-removal tool to grasp the tick. Grasp the tick as close to your skin and as close to the tick's head as possible. ? Gently pull up until the tick lets go.  Try to keep the tick's head attached to its body.  Do not twist or jerk the tick.  Do not squeeze or crush the tick.  Do not try to remove a tick with heat, alcohol, petroleum jelly, or fingernail polish. How should I get rid of a tick? Here are some ways to get rid of a tick that is alive:  Place the tick in rubbing alcohol.  Place the tick in a bag or container you can close tightly.  Wrap the tick tightly  in tape.  Flush the tick down the toilet.  Contact a doctor if:  You have symptoms of a disease, such as: ? Pain in a muscle, joint, or bone. ? Trouble walking or moving your legs. ? Numbness in your legs. ? Inability to move (paralysis). ? A red rash that makes a circle (bull's-eye rash). ? Redness and swelling where the tick bit you. ? A fever. ? Throwing up (vomiting) over and over. ? Diarrhea. ? Weight loss. ? Tender and swollen lymph glands. ? Shortness of breath. ? Cough. ? Belly pain (abdominal pain). ? Headache. ? Being more tired than normal. ? A change in how alert (conscious) you are. ? Confusion. Get help right away if:  You cannot remove a tick.  A part of a tick breaks off and gets stuck in your skin.  You are feeling worse. Summary  Ticks may carry germs that can make you sick.  To prevent tick  bites, wear long sleeves, long pants, and light colors. Use insect repellent. Follow the instructions on the bottle.  If the tick is biting, do not try to remove it with heat, alcohol, petroleum jelly, or fingernail polish.  Use tweezers, curved forceps, or a tick-removal tool to grasp the tick. Gently pull up until the tick lets go. Do not twist or jerk the tick. Do not squeeze or crush the tick.  If you have symptoms, contact a doctor. This information is not intended to replace advice given to you by your health care provider. Make sure you discuss any questions you have with your health care provider. Document Released: 03/14/2009 Document Revised: 03/30/2016 Document Reviewed: 03/30/2016 Elsevier Interactive Patient Education  2018 [employer].

## 2017-05-10 ENCOUNTER — Telehealth: Payer: Self-pay | Admitting: *Deleted

## 2017-05-10 NOTE — Telephone Encounter (Signed)
05/10/17 0900- Pt called states she has swelling to left eyelid, "fluid in both knees", and "funny taste in mouth" after taking 2 doses of Doxycycline. States the symptoms she was seen for yesterday are gone, afebrile. Pt denies any other symptoms/ side effects of medication, denies rash/ hives. Writer consulted with Chalmers Cater NP, advised pt to stop taking Doxycycline, take Benadryl PRN for swelling as long as she is not driving, and Claritin. Pt states she is going to the beach today for the [place]. Further advised if her symptoms return, or worsen, any rash appears to [place]. Pt verbalizes understanding and has no further needs or concerns at this time.

## 2017-11-12 ENCOUNTER — Ambulatory Visit: Payer: BLUE CROSS/BLUE SHIELD | Admitting: Medical

## 2017-11-12 ENCOUNTER — Encounter: Payer: Self-pay | Admitting: Medical

## 2017-11-12 VITALS — BP 114/69 | HR 89 | Temp 98.1°F | Resp 16 | Wt 271.6 lb

## 2017-11-12 DIAGNOSIS — H6983 Other specified disorders of Eustachian tube, bilateral: Secondary | ICD-10-CM

## 2017-11-12 DIAGNOSIS — J029 Acute pharyngitis, unspecified: Secondary | ICD-10-CM

## 2017-11-12 NOTE — Patient Instructions (Addendum)
OTC Motrin take as directed for pain. Dilute salt water 4 times a day. Avoid acidic foods, tomatoes/ orange juice.  Soft foods     Pharyngitis Pharyngitis is a sore throat (pharynx). There is redness, pain, and swelling of your throat. Follow these instructions at home:  Drink enough fluids to keep your pee (urine) clear or pale yellow.  Only take medicine as told by your doctor. ? You may get sick again if you do not take medicine as told. Finish your medicines, even if you start to feel better. ? Do not take aspirin.  Rest.  Rinse your mouth (gargle) with salt water ( tsp of salt per 1 qt of water) every 1-2 hours. This will help the pain.  If you are not at risk for choking, you can suck on hard candy or sore throat lozenges. Contact a doctor if:  You have large, tender lumps on your neck.  You have a rash.  You cough up green, yellow-brown, or bloody spit. Get help right away if:  You have a stiff neck.  You drool or cannot swallow liquids.  You throw up (vomit) or are not able to keep medicine or liquids down.  You have very bad pain that does not go away with medicine.  You have problems breathing (not from a stuffy nose). This information is not intended to replace advice given to you by your health care provider. Make sure you discuss any questions you have with your health care provider. Document Released: 06/06/2007 Document Revised: 05/26/2015 Document Reviewed: 08/25/2012 Elsevier Interactive Patient Education  2017 [employer].

## 2017-11-12 NOTE — Progress Notes (Signed)
  Subjective:     Patient ID: Catherine Fields, female   DOB: [birth year], 32 y.o.   MRN: [id number]  HPI 32 yo female in non acute distress. Started on Sunday night with aches, sore throat. Looked in throat and saw a white spot.  Has tried nothing for the sore throat.Denies cough , shortness of breath or chest pain.  Feels it is getting worse.    Blood pressure 114/69, pulse 89, temperature 98.1 F (36.7 C), temperature source Tympanic, resp. rate 16, weight 271 lb 9.6 oz (123.2 kg), last menstrual period 10/29/2017, SpO2 99 %.  Ate chili last night and today. Review of Systems  Constitutional: Negative for chills and fever.  HENT: Positive for sore throat. Negative for congestion, ear pain, postnasal drip, rhinorrhea, sinus pressure and sinus pain.   Eyes: Negative for discharge and itching.  Respiratory: Negative for cough, shortness of breath and wheezing.   Cardiovascular: Negative for chest pain.  Gastrointestinal: Negative for abdominal pain.  Endocrine: Negative for polydipsia, polyphagia and polyuria.  Genitourinary: Negative for dysuria.  Musculoskeletal: Positive for myalgias.  Skin: Negative for rash.  Allergic/Immunologic: Positive for environmental allergies. Negative for food allergies.  Neurological: Negative for dizziness, syncope, light-headedness and headaches.  Hematological: Positive for adenopathy.  Psychiatric/Behavioral: Negative for behavioral problems, self-injury and suicidal ideas.   Did allergy injections finished them  3 years ago.     Objective:   Physical Exam  Constitutional: She is oriented to person, place, and time. She appears well-developed and well-nourished.  HENT:  Head: Normocephalic and atraumatic.  Right Ear: Hearing and ear canal normal.  Left Ear: Hearing and ear canal normal.  Mouth/Throat: Uvula is midline and mucous membranes are normal. No uvula swelling. Posterior oropharyngeal erythema (mild) present. No oropharyngeal exudate or  posterior oropharyngeal edema.  Eyes: Pupils are equal, round, and reactive to light. EOM are normal.  Neck: Normal range of motion. Neck supple.  Cardiovascular: Normal rate, regular rhythm and normal heart sounds.  Pulmonary/Chest: Effort normal and breath sounds normal.  Lymphadenopathy:    She has no cervical adenopathy.  Neurological: She is alert and oriented to person, place, and time.  Skin: Skin is warm and dry.  Psychiatric: She has a normal mood and affect. Her behavior is normal.  Nursing note and vitals reviewed.      Assessment:   Pharyngitis, Eustachian tube dysfunction.    Plan:     Dilute salt water gargles QID, soft foods, avoid acidic foods., Take something for pain like Ibuprofen, take per package insert.  Declines Motrin in clinic has some at ;her desk. If worsening by Friday will call in antibiotics if not better. Patient verbalizes understanding and has no questions at discharge.

## 2018-04-22 ENCOUNTER — Encounter: Payer: Self-pay | Admitting: Nurse Practitioner

## 2018-04-22 ENCOUNTER — Ambulatory Visit: Payer: BLUE CROSS/BLUE SHIELD | Admitting: Nurse Practitioner

## 2018-04-22 ENCOUNTER — Other Ambulatory Visit: Payer: Self-pay

## 2018-04-22 VITALS — BP 118/73 | HR 79 | Temp 98.3°F | Resp 16 | Wt 266.6 lb

## 2018-04-22 DIAGNOSIS — Z23 Encounter for immunization: Secondary | ICD-10-CM

## 2018-04-22 DIAGNOSIS — S6992XA Unspecified injury of left wrist, hand and finger(s), initial encounter: Secondary | ICD-10-CM

## 2018-04-22 NOTE — Patient Instructions (Signed)
Monitor for s/s of infection as discussed in office today; keep area clean and dry. Ok to have a Band-Aid on while at work, but would prefer to leave area open to heal when in home environment. Cover when out of the home. Tdap given for tetanus prophylaxis Encouraged patient to call the office or primary care doctor for an appointment if no improvement in symptoms or if symptoms change or worsen after 72 hours of planned treatment. Patient verbalized understanding of all instructions given/reviewed and has no further questions or concerns at this time.

## 2018-04-22 NOTE — Progress Notes (Signed)
   Subjective:    Patient ID: Catherine Fields, female    DOB: March 13, 1985, 33 y.o.   MRN: [id number]  HPICourtney is here today because she called requesting a tetanus vaccine in which she reports she hasn't had one in the last 5 years. 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 Note reviewed and charted 2017 DTap vaccine which is for a different patient population and no chart or visit linked to this. She does not recall a vaccine in 2017. This occurred this morning and she has no concerns. 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 She denies any fevers or concerns.     Review of Systems  Constitutional: Negative for fever.  Skin: Positive for wound.       Objective:   Physical Exam HENT:     Head: Normocephalic.  Skin:    General: Skin is warm.     Comments: Left palmer superficial puncture that has scant bloody drainage.   Neurological:     Mental Status: She is alert.  Psychiatric:        Mood and Affect: Mood normal.           Assessment & Plan:

## 2018-09-17 ENCOUNTER — Other Ambulatory Visit: Payer: Self-pay | Admitting: *Deleted

## 2018-09-17 ENCOUNTER — Telehealth: Payer: Self-pay

## 2018-09-17 DIAGNOSIS — Z20822 Contact with and (suspected) exposure to covid-19: Secondary | ICD-10-CM

## 2018-09-17 NOTE — Telephone Encounter (Signed)
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 Ratcliffe, PA and per CDC guidelines, instructed her to quarantine for 14 days if she or friend test positive and until her results come back; if she  and friend test negative she is to monitor her signs, symptoms and temperature;  If she develops any s/s she is to go to an Urgent [facility] or contact us; pt appreciative of the information and verb u/o

## 2018-09-18 LAB — NOVEL CORONAVIRUS, NAA: SARS-CoV-2, NAA: NOT DETECTED

## 2018-10-29 ENCOUNTER — Other Ambulatory Visit: Payer: Self-pay

## 2018-10-29 ENCOUNTER — Ambulatory Visit: Payer: BC Managed Care – PPO

## 2018-10-29 DIAGNOSIS — Z23 Encounter for immunization: Secondary | ICD-10-CM

## 2018-12-10 ENCOUNTER — Other Ambulatory Visit: Payer: Self-pay | Admitting: *Deleted

## 2018-12-10 DIAGNOSIS — Z20822 Contact with and (suspected) exposure to covid-19: Secondary | ICD-10-CM

## 2018-12-12 LAB — NOVEL CORONAVIRUS, NAA: SARS-CoV-2, NAA: NOT DETECTED

## 2018-12-15 ENCOUNTER — Other Ambulatory Visit: Payer: Self-pay

## 2018-12-15 DIAGNOSIS — Z20822 Contact with and (suspected) exposure to covid-19: Secondary | ICD-10-CM

## 2018-12-16 LAB — NOVEL CORONAVIRUS, NAA: SARS-CoV-2, NAA: DETECTED — AB

## 2018-12-18 ENCOUNTER — Other Ambulatory Visit: Payer: BLUE CROSS/BLUE SHIELD

## 2018-12-18 ENCOUNTER — Telehealth: Payer: Self-pay | Admitting: Nurse Practitioner

## 2018-12-18 NOTE — Telephone Encounter (Signed)
Called to Discuss with patient about Covid symptoms and the use of bamlanivimab, a monoclonal antibody infusion for those with mild to moderate Covid symptoms and at a high risk of hospitalization.     Pt is qualified for this infusion at the [facility] infusion center due to co-morbid conditions and/or a member of an at-risk group.    Patient's BMI is over 35.   Unable to reach pt

## 2018-12-19 ENCOUNTER — Telehealth: Payer: Self-pay | Admitting: Nurse Practitioner

## 2018-12-19 ENCOUNTER — Other Ambulatory Visit: Payer: Self-pay | Admitting: Nurse Practitioner

## 2018-12-19 NOTE — Telephone Encounter (Signed)
Pt returned a call from [place] in regards to the infusion therapy. Routing to [place] for her to contact pt.

## 2018-12-19 NOTE — Progress Notes (Signed)
  I connected by phone with Catherine Fields on 12/19/2018 at 2:54 PM to discuss the potential use of an new treatment for mild to moderate COVID-19 viral infection in non-hospitalized patients.  This patient is a 33 y.o. female that meets the FDA criteria for Emergency Use Authorization of bamlanivimab or casirivimab\imdevimab.  Has a (+) direct SARS-CoV-2 viral test result  Has mild or moderate COVID-19   Is ? 33 years of age and weighs ? 40 kg  Is NOT hospitalized due to COVID-19  Is NOT requiring oxygen therapy or requiring an increase in baseline oxygen flow rate due to COVID-19  Is within 10 days of symptom onset  Has at least one of the high risk factor(s) for progression to severe COVID-19 and/or hospitalization as defined in EUA.  Specific high risk criteria : BMI >/= 35   I have spoken and communicated the following to the patient or parent/caregiver:  1. FDA has authorized the emergency use of bamlanivimab and casirivimab\imdevimab for the treatment of mild to moderate COVID-19 in adults and pediatric patients with positive results of direct SARS-CoV-2 viral testing who are 85 years of age and older weighing at least 40 kg, and who are at high risk for progressing to severe COVID-19 and/or hospitalization.  2. The significant known and potential risks and benefits of bamlanivimab and casirivimab\imdevimab, and the extent to which such potential risks and benefits are unknown.  3. Information on available alternative treatments and the risks and benefits of those alternatives, including clinical trials.  4. Patients treated with bamlanivimab and casirivimab\imdevimab should continue to self-isolate and use infection control measures (e.g., wear mask, isolate, social distance, avoid sharing personal items, clean and disinfect "high touch" surfaces, and frequent handwashing) according to CDC guidelines.   5. The patient or parent/caregiver has the option to accept or refuse  bamlanivimab or casirivimab\imdevimab .  After reviewing this information with the patient, The patient agreed to proceed with receiving the bamlanimivab infusion and will be provided a copy of the Fact sheet prior to receiving the infusion.Fenton Foy 12/19/2018 2:54 PM

## 2018-12-22 ENCOUNTER — Ambulatory Visit (HOSPITAL_COMMUNITY)
Admission: RE | Admit: 2018-12-22 | Discharge: 2018-12-22 | Disposition: A | Discharge status: Home / Self Care | Payer: BC Managed Care – PPO | Source: Ambulatory Visit | Attending: Pulmonary Disease | Admitting: Pulmonary Disease

## 2018-12-22 DIAGNOSIS — U071 COVID-19: Secondary | ICD-10-CM | POA: Insufficient documentation

## 2018-12-22 MED ORDER — METHYLPREDNISOLONE SODIUM SUCC 125 MG IJ SOLR: 125.0000 mg | Freq: Once | INTRAMUSCULAR | Status: DC | PRN

## 2018-12-22 MED ORDER — EPINEPHRINE 0.3 MG/0.3ML IJ SOAJ: 0.3000 mg | Freq: Once | INTRAMUSCULAR | Status: DC | PRN

## 2018-12-22 MED ORDER — SODIUM CHLORIDE 0.9 % IV SOLN
700.0000 mg | Freq: Once | INTRAVENOUS | Status: AC
  Administered 2018-12-22: 700 mg via INTRAVENOUS
  Filled 2018-12-22: qty 20

## 2018-12-22 MED ORDER — ALBUTEROL SULFATE HFA 108 (90 BASE) MCG/ACT IN AERS: 2.0000 | INHALATION_SPRAY | Freq: Once | RESPIRATORY_TRACT | Status: DC | PRN

## 2018-12-22 MED ORDER — FAMOTIDINE IN NACL 20-0.9 MG/50ML-% IV SOLN: 20.0000 mg | Freq: Once | INTRAVENOUS | Status: DC | PRN

## 2018-12-22 MED ORDER — SODIUM CHLORIDE 0.9 % IV SOLN
INTRAVENOUS | Status: DC | PRN
  Administered 2018-12-22: 250 mL via INTRAVENOUS

## 2018-12-22 MED ORDER — DIPHENHYDRAMINE HCL 50 MG/ML IJ SOLN: 50.0000 mg | Freq: Once | INTRAMUSCULAR | Status: DC | PRN

## 2018-12-22 NOTE — Discharge Instructions (Signed)
Prevent the Spread of COVID-19 if You Are Sick If you are sick with COVID-19 or think you might have COVID-19, follow the steps below to help protect other people in your home and community. Stay home except to get medical care.  Stay home. Most people with COVID-19 have mild illness and are able to recover at home without medical care. Do not leave your home, except to get medical care. Do not visit public areas.  Take care of yourself. Get rest and stay hydrated.  Get medical care when needed. Call your doctor before you go to their office for care. But, if you have trouble breathing or other concerning symptoms, call 911 for immediate help.  Avoid public transportation, ride-sharing, or taxis. Separate yourself from other people and pets in your home.  As much as possible, stay in a specific room and away from other people and pets in your home. Also, you should use a separate bathroom, if available. If you need to be around other people or animals in or outside of the home, wear a cloth face covering. ? See COVID-19 and Animals if you have questions about pets: https://www.cdc.gov/coronavirus/2019-ncov/faq.html#COVID19animals Monitor your symptoms.  Common symptoms of COVID-19 include fever and cough. Trouble breathing is a more serious symptom that means you should get medical attention.  Follow care instructions from your healthcare provider and local health department. Your local health authorities will give instructions on checking your symptoms and reporting information. If you develop emergency warning signs for COVID-19 get medical attention immediately.  Emergency warning signs include*:  Trouble breathing  Persistent pain or pressure in the chest  New confusion or not able to be woken  Bluish lips or face *This list is not all inclusive. Please consult your medical provider for any other symptoms that are severe or concerning to you. Call 911 if you have a medical  emergency. If you have a medical emergency and need to call 911, notify the operator that you have or think you might have, COVID-19. If possible, put on a facemask before medical help arrives. Call ahead before visiting your doctor.  Call ahead. Many medical visits for routine care are being postponed or done by phone or telemedicine.  If you have a medical appointment that cannot be postponed, call your doctor's office. This will help the office protect themselves and other patients. If you are sick, wear a cloth covering over your nose and mouth.  You should wear a cloth face covering over your nose and mouth if you must be around other people or animals, including pets (even at home).  You don't need to wear the cloth face covering if you are alone. If you can't put on a cloth face covering (because of trouble breathing for example), cover your coughs and sneezes in some other way. Try to stay at least 6 feet away from other people. This will help protect the people around you. Note: During the COVID-19 pandemic, medical grade facemasks are reserved for healthcare workers and some first responders. You may need to make a cloth face covering using a scarf or bandana. Cover your coughs and sneezes.  Cover your mouth and nose with a tissue when you cough or sneeze.  Throw used tissues in a lined trash can.  Immediately wash your hands with soap and water for at least 20 seconds. If soap and water are not available, clean your hands with an alcohol-based hand sanitizer that contains at least 60% alcohol. Clean your hands often.    Wash your hands often with soap and water for at least 20 seconds. This is especially important after blowing your nose, coughing, or sneezing; going to the bathroom; and before eating or preparing food.  Use hand sanitizer if soap and water are not available. Use an alcohol-based hand sanitizer with at least 60% alcohol, covering all surfaces of your hands and rubbing  them together until they feel dry.  Soap and water are the best option, especially if your hands are visibly dirty.  Avoid touching your eyes, nose, and mouth with unwashed hands. Avoid sharing personal household items.  Do not share dishes, drinking glasses, cups, eating utensils, towels, or bedding with other people in your home.  Wash these items thoroughly after using them with soap and water or put them in the dishwasher. Clean all "high-touch" surfaces everyday.  Clean and disinfect high-touch surfaces in your "sick room" and bathroom. Let someone else clean and disinfect surfaces in common areas, but not your bedroom and bathroom.  If a caregiver or other person needs to clean and disinfect a sick person's bedroom or bathroom, they should do so on an as-needed basis. The caregiver/other person should wear a mask and wait as long as possible after the sick person has used the bathroom. High-touch surfaces include phones, remote controls, counters, tabletops, doorknobs, bathroom fixtures, toilets, keyboards, tablets, and bedside tables.  Clean and disinfect areas that may have blood, stool, or body fluids on them.  Use household cleaners and disinfectants. Clean the area or item with soap and water or another detergent if it is dirty. Then use a household disinfectant. ? Be sure to follow the instructions on the label to ensure safe and effective use of the product. Many products recommend keeping the surface wet for several minutes to ensure germs are killed. Many also recommend precautions such as wearing gloves and making sure you have good ventilation during use of the product. ? Most EPA-registered household disinfectants should be effective. How to discontinue home isolation  People with COVID-19 who have stayed home (home isolated) can stop home isolation under the following conditions: ? If you will not have a test to determine if you are still contagious, you can leave home  after these three things have happened:  You have had no fever for at least 72 hours (that is three full days of no fever without the use of medicine that reduces fevers) AND  other symptoms have improved (for example, when your cough or shortness of breath has improved) AND  at least 10 days have passed since your symptoms first appeared. ? If you will be tested to determine if you are still contagious, you can leave home after these three things have happened:  You no longer have a fever (without the use of medicine that reduces fevers) AND  other symptoms have improved (for example, when your cough or shortness of breath has improved) AND  you received two negative tests in a row, 24 hours apart. Your doctor will follow CDC guidelines. In all cases, follow the guidance of your healthcare provider and local health department. The decision to stop home isolation should be made in consultation with your healthcare provider and state and local health departments. Local decisions depend on local circumstances. cdc.gov/coronavirus 05/04/2018 This information is not intended to replace advice given to you by your health care provider. Make sure you discuss any questions you have with your health care provider. Document Released: 04/15/2018 Document Revised: 05/14/2018 Document Reviewed: 04/15/2018   Elsevier Patient Education  2020 [employer]  

## 2018-12-22 NOTE — Progress Notes (Signed)
  Diagnosis: COVID-19  Physician:  Procedure: [facility] Med: bamlanivimab infusion - Provided patient with bamlanimivab fact sheet for patients, parents and caregivers prior to infusion.  Complications: No immediate complications noted.  Discharge: Discharged home   Virgilio Belling 12/22/2018

## 2018-12-24 NOTE — Telephone Encounter (Signed)
Please orders only encounter dated 12/19/2018. Kenney Houseman spoke with the pt. Message will be closed.

## 2019-09-17 ENCOUNTER — Other Ambulatory Visit: Payer: Self-pay

## 2019-09-17 ENCOUNTER — Encounter: Payer: Self-pay | Admitting: Nurse Practitioner

## 2019-09-17 ENCOUNTER — Ambulatory Visit: Payer: BC Managed Care – PPO | Admitting: Nurse Practitioner

## 2019-09-17 VITALS — BP 125/81 | HR 94 | Temp 97.3°F | Resp 16

## 2019-09-17 DIAGNOSIS — R21 Rash and other nonspecific skin eruption: Secondary | ICD-10-CM

## 2019-09-17 MED ORDER — CLOTRIMAZOLE-BETAMETHASONE 1-0.05 % EX CREA: 1.0000 "application " | TOPICAL_CREAM | Freq: Two times a day (BID) | CUTANEOUS | 0 refills | Status: DC

## 2019-09-17 NOTE — Progress Notes (Signed)
   Subjective:    Patient ID: Hayden Mabin, female    DOB: Apr 19, 1985, 34 y.o.   MRN: [id number]  Rash This is a new problem. The problem has been gradually worsening since onset. The affected locations include the abdomen. The rash is characterized by dryness and redness. She was exposed to nothing. Past treatments include topical steroids and antihistamine. The treatment provided no relief.   Noticed rash after labor day weekend when she was outside for increased amount of time, also went for a hike in [place]. Denies any new soaps/perfumes or detergents. No new medications or supplements. Has a dog without symptoms, shares a bed partner has no symptoms. Rash itches at times, denies pain. Reacts to mosquito bites tupically but not this bad. Tried benadryl topical and hydrocortisone without improvement. Claritin typically makes her sleepy. Benadryl at night is OK.    Review of Systems  Constitutional: Negative.   HENT: Negative.   Respiratory: Negative.   Skin: Positive for rash.       Objective:   Physical Exam Constitutional:      Appearance: Normal appearance.  Skin:    General: Skin is warm.     Findings: Erythema and rash present. Rash is macular.       Neurological:     Mental Status: She is alert.           Assessment & Plan:  Patient will continue benadryl at night as needed   Rx for topical combination antifungal/steroid cream to use for up to 2 weeks, she has follow up with Dermatology in two weeks if rash has not improved. She will RTC if no improvement or worsening symptoms prior to that as discussed.   Will wash with non scented soap, may try coconut oil as discussed.   Discussed keeping skin dry as much as possible.

## 2019-09-18 ENCOUNTER — Other Ambulatory Visit: Payer: Self-pay | Admitting: Nurse Practitioner

## 2019-09-18 NOTE — Progress Notes (Signed)
Spoke with patient on the phone, rash is spreading across abdomen now and she has spots on her face. Was able to tolerate one claritin today and took benadryl last night. Also applied topical antifungal/steroid cream.   Will Rx oral steroids for relief.   RTC next week if symptoms persist or worsen, has Derm apt for following week if symptoms persists.

## 2019-09-25 MED ORDER — PREDNISONE 10 MG (21) PO TBPK: ORAL_TABLET | ORAL | 0 refills | Status: DC

## 2019-09-30 ENCOUNTER — Ambulatory Visit: Payer: BC Managed Care – PPO | Admitting: Dermatology

## 2019-12-01 DIAGNOSIS — Z23 Encounter for immunization: Secondary | ICD-10-CM | POA: Diagnosis not present

## 2019-12-22 ENCOUNTER — Ambulatory Visit: Payer: BC Managed Care – PPO | Admitting: Dermatology

## 2019-12-22 ENCOUNTER — Other Ambulatory Visit: Payer: Self-pay

## 2019-12-22 DIAGNOSIS — L578 Other skin changes due to chronic exposure to nonionizing radiation: Secondary | ICD-10-CM

## 2019-12-22 DIAGNOSIS — D1800 Hemangioma unspecified site: Secondary | ICD-10-CM

## 2019-12-22 DIAGNOSIS — L820 Inflamed seborrheic keratosis: Secondary | ICD-10-CM

## 2019-12-22 DIAGNOSIS — L814 Other melanin hyperpigmentation: Secondary | ICD-10-CM

## 2019-12-22 DIAGNOSIS — D225 Melanocytic nevi of trunk: Secondary | ICD-10-CM | POA: Diagnosis not present

## 2019-12-22 DIAGNOSIS — Z1283 Encounter for screening for malignant neoplasm of skin: Secondary | ICD-10-CM

## 2019-12-22 DIAGNOSIS — D229 Melanocytic nevi, unspecified: Secondary | ICD-10-CM

## 2019-12-22 DIAGNOSIS — D2372 Other benign neoplasm of skin of left lower limb, including hip: Secondary | ICD-10-CM | POA: Diagnosis not present

## 2019-12-22 DIAGNOSIS — D489 Neoplasm of uncertain behavior, unspecified: Secondary | ICD-10-CM

## 2019-12-22 DIAGNOSIS — L821 Other seborrheic keratosis: Secondary | ICD-10-CM

## 2019-12-22 NOTE — Patient Instructions (Addendum)
Cryotherapy Aftercare  . Wash gently with soap and water everyday.   Marland Kitchen Apply Vaseline and Band-Aid daily until healed.     Melanoma ABCDEs  Melanoma is the most dangerous type of skin cancer, and is the leading cause of death from skin disease.  You are more likely to develop melanoma if you:  Have light-colored skin, light-colored eyes, or red or blond hair  Spend a lot of time in the sun  Tan regularly, either outdoors or in a tanning bed  Have had blistering sunburns, especially during childhood  Have a close family member who has had a melanoma  Have atypical moles or large birthmarks  Early detection of melanoma is key since treatment is typically straightforward and cure rates are extremely high if we catch it early.   The first sign of melanoma is often a change in a mole or a new dark spot.  The ABCDE system is a way of remembering the signs of melanoma.  A for asymmetry:  The two halves do not match. B for border:  The edges of the growth are irregular. C for color:  A mixture of colors are present instead of an even brown color. D for diameter:  Melanomas are usually (but not always) greater than 82mm - the size of a pencil eraser. E for evolution:  The spot keeps changing in size, shape, and color.  Please check your skin once per month between visits. You can use a small mirror in front and a large mirror behind you to keep an eye on the back side or your body.   If you see any new or changing lesions before your next follow-up, please call to schedule a visit.  Please continue daily skin protection including broad spectrum sunscreen SPF 30+ to sun-exposed areas, reapplying every 2 hours as needed when you're outdoors.  Wound Care Instructions  1. Cleanse wound gently with soap and water once a day then pat dry with clean gauze. Apply a thing coat of Petrolatum (petroleum jelly, "Vaseline") over the wound (unless you have an allergy to this). We recommend that  you use a new, sterile tube of Vaseline. Do not pick or remove scabs. Do not remove the yellow or white "healing tissue" from the base of the wound.  2. Cover the wound with fresh, clean, nonstick gauze and secure with paper tape. You may use Band-Aids in place of gauze and tape if the would is small enough, but would recommend trimming much of the tape off as there is often too much. Sometimes Band-Aids can irritate the skin.  3. You should call the office for your biopsy report after 1 week if you have not already been contacted.  4. If you experience any problems, such as abnormal amounts of bleeding, swelling, significant bruising, significant pain, or evidence of infection, please call the office immediately.  5. FOR ADULT SURGERY PATIENTS: If you need something for pain relief you may take 1 extra strength Tylenol (acetaminophen) AND 2 Ibuprofen (200mg  each) together every 4 hours as needed for pain. (do not take these if you are allergic to them or if you have a reason you should not take them.) Typically, you may only need pain medication for 1 to 3 days.    Seborrheic Keratosis  What causes seborrheic keratoses? Seborrheic keratoses are harmless, common skin growths that first appear during adult life.  As time goes by, more growths appear.  Some people may develop a large number of them.  Seborrheic keratoses appear on both covered and uncovered body parts.  They are not caused by sunlight.  The tendency to develop seborrheic keratoses can be inherited.  They vary in color from skin-colored to gray, brown, or even black.  They can be either smooth or have a rough, warty surface.   Seborrheic keratoses are superficial and look as if they were stuck on the skin.  Under the microscope this type of keratosis looks like layers upon layers of skin.  That is why at times the top layer may seem to fall off, but the rest of the growth remains and re-grows.    Treatment Seborrheic keratoses do not  need to be treated, but can easily be removed in the office.  Seborrheic keratoses often cause symptoms when they rub on clothing or jewelry.  Lesions can be in the way of shaving.  If they become inflamed, they can cause itching, soreness, or burning.  Removal of a seborrheic keratosis can be accomplished by freezing, burning, or surgery. If any spot bleeds, scabs, or grows rapidly, please return to have it checked, as these can be an indication of a skin cancer.

## 2019-12-22 NOTE — Progress Notes (Signed)
Follow-Up Visit   Subjective  Catherine Fields is a 34 y.o. female who presents for the following: Annual Exam (Patient here for yearly TBSE. Patient reports no history of skin cancer. Patient has area on left lower leg she would have looked at. She states it bothers her when shaving and would like to discuss removal. ). She also has a irritated growth on her face.  The patient presents for Total-Body Skin Exam (TBSE) for skin cancer screening and mole check.   The following portions of the chart were reviewed this encounter and updated as appropriate:        Objective  Well appearing patient in no apparent distress; mood and affect are within normal limits.  A full examination was performed including scalp, head, eyes, ears, nose, lips, neck, chest, axillae, abdomen, back, buttocks, bilateral upper extremities, bilateral lower extremities, hands, feet, fingers, toes, fingernails, and toenails. All findings within normal limits unless otherwise noted below.  Objective  Left upper pretibia: 0.7 cm firm pink nodule  Dermatofibroma vs other           Objective  Right upper abdomen: 2 mm dark brown macule- no changes per pt.  Objective  right mid cheek: Erythematous keratotic or waxy stuck-on papule   Assessment & Plan  Neoplasm of uncertain behavior Left upper pretibia  Epidermal / dermal shaving  Lesion diameter (cm):  0.7 Informed consent: discussed and consent obtained   Patient was prepped and draped in usual sterile fashion: Area prepped with alcohol. Anesthesia: the lesion was anesthetized in a standard fashion   Anesthetic:  1% lidocaine w/ epinephrine 1-100,000 buffered w/ 8.4% NaHCO3 Instrument used: flexible razor blade   Hemostasis achieved with: pressure, aluminum chloride and electrodesiccation   Outcome: patient tolerated procedure well   Post-procedure details: wound care instructions given   Post-procedure details comment:  Ointment and small  bandage applied.   Specimen 1 - Surgical pathology Differential Diagnosis: Dermatofibroma vs other  Check Margins: No 7 mm firm pink nodule    Nevus Right upper abdomen  Benign-appearing.  Stable. Observation.  Call clinic for new or changing moles.  Recommend daily use of broad spectrum spf 30+ sunscreen to sun-exposed areas.    Inflamed seborrheic keratosis right mid cheek  Destruction of lesion - right mid cheek  Destruction method: cryotherapy   Informed consent: discussed and consent obtained   Lesion destroyed using liquid nitrogen: Yes   Region frozen until ice ball extended beyond lesion: Yes   Outcome: patient tolerated procedure well with no complications   Post-procedure details: wound care instructions given     Lentigines - Scattered tan macules - Discussed due to sun exposure - Benign, observe - Call for any changes  Seborrheic Keratoses - Stuck-on, waxy, tan-brown papules and plaques on left posterior arm  - Discussed benign etiology and prognosis. - Observe - Call for any changes  Melanocytic Nevi - Tan-brown and/or pink-flesh-colored symmetric macules and papules - Benign appearing on exam today - Observation - Call clinic for new or changing moles - Recommend daily use of broad spectrum spf 30+ sunscreen to sun-exposed areas.   Hemangiomas - Red papules - Discussed benign nature - Observe - Call for any changes  Actinic Damage - Chronic, secondary to cumulative UV/sun exposure - diffuse scaly erythematous macules with underlying dyspigmentation - Recommend daily broad spectrum sunscreen SPF 30+ to sun-exposed areas, reapply every 2 hours as needed.  - Call for new or changing lesions.  Skin cancer screening performed  today.  Return in about 1 year (around 12/21/2020) for TBSE .  I, Ruthell Rummage, CMA, am acting as scribe for Brendolyn Patty, MD.  Documentation: I have reviewed the above documentation for accuracy and completeness, and I  agree with the above.  Brendolyn Patty MD

## 2020-01-05 ENCOUNTER — Telehealth: Payer: Self-pay

## 2020-01-05 NOTE — Telephone Encounter (Signed)
-----   Message from Willeen Niece, MD sent at 01/05/2020 10:48 AM EST ----- Skin , left upper pretibia SURFACE OF A DERMATOFIBROMA  Benign, may recur

## 2020-01-05 NOTE — Telephone Encounter (Signed)
Left pt msg advising bx showed benign dermatofibroma, may recur. JS

## 2020-04-07 DIAGNOSIS — Z20822 Contact with and (suspected) exposure to covid-19: Secondary | ICD-10-CM | POA: Diagnosis not present

## 2020-10-26 ENCOUNTER — Ambulatory Visit: Payer: Self-pay

## 2020-10-31 ENCOUNTER — Other Ambulatory Visit: Payer: Self-pay

## 2020-10-31 ENCOUNTER — Ambulatory Visit: Payer: Self-pay | Admitting: Medical

## 2020-10-31 ENCOUNTER — Encounter: Payer: Self-pay | Admitting: Medical

## 2020-10-31 VITALS — BP 120/88 | HR 89 | Temp 98.5°F | Resp 18

## 2020-10-31 DIAGNOSIS — R0981 Nasal congestion: Secondary | ICD-10-CM

## 2020-10-31 DIAGNOSIS — J0100 Acute maxillary sinusitis, unspecified: Secondary | ICD-10-CM

## 2020-10-31 DIAGNOSIS — R051 Acute cough: Secondary | ICD-10-CM

## 2020-10-31 DIAGNOSIS — B3731 Acute candidiasis of vulva and vagina: Secondary | ICD-10-CM

## 2020-10-31 LAB — POC COVID19 BINAXNOW: SARS Coronavirus 2 Ag: NEGATIVE

## 2020-10-31 MED ORDER — AMOXICILLIN-POT CLAVULANATE 875-125 MG PO TABS: 1.0000 | ORAL_TABLET | Freq: Two times a day (BID) | ORAL | 0 refills | Status: DC

## 2020-10-31 MED ORDER — FLUCONAZOLE 150 MG PO TABS: 150.0000 mg | ORAL_TABLET | Freq: Every day | ORAL | 0 refills | Status: DC

## 2020-10-31 NOTE — Progress Notes (Signed)
Subjective:    Patient ID: Catherine Fields, female    DOB: 05-28-1985, 35 y.o.   MRN: [id number]  HPI 35 yo female in non acute distress. Green discharge out of nose and productive cough  yellowish white. Progressively worse x 7 days. Initial symptoms started early last week. No fever or chills. No wheezing or chest pain.  Afrin clartinin and mucinex.  Today's Vitals   10/31/20 1518  BP: 120/88  Pulse: 89  Resp: 18  Temp: 98.5 F (36.9 C)  TempSrc: Tympanic  SpO2: 96%        Allergies  Allergen Reactions   Ciprofloxacin     Calf pain and muscle aches   Sulfonamide Derivatives    Sulfa Antibiotics Rash    Mother 59 yo MI 1 stent 90 % blockage and no preexisting condition other than preeclampsia   Did Covid-tests Tue Wed Thursday of last week all negative.  Review of Systems  Constitutional:  Positive for fever (one week ago did not take). Negative for chills and fatigue.  HENT:  Positive for congestion, rhinorrhea, sneezing and sore throat (worse last week). Negative for ear pain (feels full and popping mildly), sinus pressure and sinus pain.   Respiratory:  Positive for cough (productive yellow). Negative for wheezing.   Cardiovascular:  Negative for chest pain.  Gastrointestinal:  Negative for abdominal pain and diarrhea.  Musculoskeletal:  Negative for myalgias (last Tuesday night).  Skin:  Negative for color change.  Allergic/Immunologic: Positive for environmental allergies. Negative for food allergies.  Neurological:  Positive for headaches (today). Negative for dizziness, syncope and light-headedness.  Hematological:  Positive for adenopathy.      Objective:   Physical Exam Vitals and nursing note reviewed.  Constitutional:      Appearance: Normal appearance.  HENT:     Head: Normocephalic and atraumatic.     Right Ear: Ear canal and external ear normal.     Left Ear: Ear canal and external ear normal.     Nose: Congestion and rhinorrhea present.      Mouth/Throat:     Mouth: Mucous membranes are moist.     Pharynx: Oropharynx is clear. No oropharyngeal exudate or posterior oropharyngeal erythema.  Eyes:     Conjunctiva/sclera: Conjunctivae normal.     Pupils: Pupils are equal, round, and reactive to light.  Cardiovascular:     Rate and Rhythm: Normal rate and regular rhythm.  Pulmonary:     Effort: Pulmonary effort is normal.     Breath sounds: Normal breath sounds. No wheezing.     Comments: No coughing in room Musculoskeletal:        General: Normal range of motion.     Cervical back: Normal range of motion and neck supple.  Lymphadenopathy:     Cervical: No cervical adenopathy.  Skin:    General: Skin is warm and dry.  Neurological:     General: No focal deficit present.     Mental Status: She is alert and oriented to person, place, and time. Mental status is at baseline.  Psychiatric:        Mood and Affect: Mood normal.        Behavior: Behavior normal.        Thought Content: Thought content normal.        Judgment: Judgment normal.     Hoarse voicmorbid obese  POC Covid test today  negative  Recent Results (from the past 2160 hour(s))  POC COVID-19  Status: Normal   Collection Time: 10/31/20  3:46 PM  Result Value Ref Range   SARS Coronavirus 2 Ag Negative Negative    Comment: Patient aware of negative results. Has appt with H.Saima Monterroso PAC for further eval.     Assessment & Plan:  Sinusitis maxillaly PND (Post nasal discharge) continue Claritin stop Afrin start Flonase ETD   (eustachian tube dysfunction)continue mucinex per package instructions. Diflucan for yeast vaginitis 2nd antibiotic use. Meds ordered this encounter  Medications   amoxicillin-clavulanate (AUGMENTIN) 875-125 MG tablet    Sig: Take 1 tablet by mouth 2 (two) times daily.    Dispense:  20 tablet    Refill:  0   fluconazole (DIFLUCAN) 150 MG tablet    Sig: Take 1 tablet (150 mg total) by mouth daily.    Dispense:  20 tablet     Refill:  0   Recommended patient to schedule Fasting Labs and then have them reviewed with me. She is chosing  Dr. Einar Pheasant as her PCP, ( mother has been going to see Dr. Nicki Reaper for over  30 years. ) If not improving on antibiotics in 3-5 days to return to the clinic. Patient verbalizes understanding and has no  questions at discharge.

## 2020-10-31 NOTE — Patient Instructions (Signed)
Fluconazole Tablets What is this medication? FLUCONAZOLE (floo KON na zole) prevents and treats fungal or yeast infections. It belongs to a group of medications called antifungals. It will not prevent or treat colds, the flu, or infections caused by bacteria or viruses. This medicine may be used for other purposes; ask your health care provider or pharmacist if you have questions. COMMON BRAND NAME(S): Diflucan What should I tell my care team before I take this medication? They need to know if you have any of these conditions: Irregular heartbeat or rhythm Kidney disease Liver disease Low levels of potassium in the blood An unusual or allergic reaction to fluconazole, other azole antifungals, medications, foods, dyes, or preservatives Pregnant or trying to get pregnant Breast-feeding How should I use this medication? Take this medication by mouth. Follow the directions on the prescription label. Do not take your medication more often than directed. Talk to your care team about the use of this medication in children. Special care may be needed. This medication has been used in children as young as 6 months of age. Overdosage: If you think you have taken too much of this medicine contact a poison control center or emergency room at once. NOTE: This medicine is only for you. Do not share this medicine with others. What if I miss a dose? If you miss a dose, take it as soon as you can. If it is almost time for your next dose, take only that dose. Do not take double or extra doses. What may interact with this medication? Do not take this medication with any of the following medications: Flibanserin Lomitapide Lonafarnib Other medications that prolong the QT interval (cause an abnormal heart rhythm) Triazolam This medication may also interact with the following medications: Certain antibiotics like rifabutin, rifampin Certain antivirals for HIV or hepatitis Certain medications for blood  pressure, heart disease, irregular heartbeat Certain medications for cholesterol like atorvastatin, lovastatin, and simvastatin Certain medications for depression, like amitriptyline, nortriptyline Certain medications for diabetes like glipizide or glyburide Certain medications for seizures like carbamazepine, phenytoin Certain medications that treat or prevent blood clots like warfarin Certain narcotic medications for pain like alfentanil, fentanyl, methadone Cyclophosphamide Cyclosporine Ibrutinib Lemborexant Midazolam NSAIDS, medications for pain and inflammation, like ibuprofen or naproxen Olaparib Sirolimus Steroid medications like prednisone Tacrolimus Theophylline Tofacitinib Tolvaptan Vinblastine Vincristine Vitamin A Voriconazole This list may not describe all possible interactions. Give your health care provider a list of all the medicines, herbs, non-prescription drugs, or dietary supplements you use. Also tell them if you smoke, drink alcohol, or use illegal drugs. Some items may interact with your medicine. What should I watch for while using this medication? Visit your care team for regular checkups. If you are taking this medication for a long time you may need blood work. Tell your care team if your symptoms do not improve. Some fungal infections need many weeks or months of treatment to cure. Alcohol can increase possible damage to your liver. Avoid alcoholic drinks. If you have a vaginal infection, do not have sex until you have finished your treatment. You can wear a sanitary napkin. Do not use tampons. Wear freshly washed cotton, not synthetic, panties. What side effects may I notice from receiving this medication? Side effects that you should report to your care team as soon as possible: Allergic reactions-skin rash, itching, hives, swelling of the face, lips, tongue, or throat Heart rhythm changes-fast or irregular heartbeat, dizziness, feeling faint or  lightheaded, chest pain, trouble breathing Liver injury-right  upper belly pain, loss of appetite, nausea, light-colored stool, dark yellow or brown urine, yellowing skin or eyes, unusual weakness or fatigue Low adrenal gland function-nausea, vomiting, loss of appetite, unusual weakness or fatigue, dizziness Rash, fever, and swollen lymph nodes Redness, blistering, peeling, or loosening of the skin, including inside the mouth Seizures Side effects that usually do not require medical attention (report to your care team if they continue or are bothersome): Change in taste Diarrhea Dizziness Headache Nausea Stomach pain This list may not describe all possible side effects. Call your doctor for medical advice about side effects. You may report side effects to FDA at 1-800-FDA-1088. Where should I keep my medication? Keep out of the reach of children. Store at room temperature below 30 degrees C (86 degrees F). Throw away any medication after the expiration date. NOTE: This sheet is a summary. It may not cover all possible information. If you have questions about this medicine, talk to your doctor, pharmacist, or health care provider.  2022 Elsevier/Gold Standard (2020-03-03 16:15:26) Amoxicillin; Clavulanic Acid Tablets What is this medication? AMOXICILLIN; CLAVULANIC ACID (a mox i SIL in; KLAV yoo lan ic AS id) treats infections caused by bacteria. It belongs to a group of medications called penicillin antibiotics. It will not treat colds, the flu, or infections caused by viruses. This medicine may be used for other purposes; ask your health care provider or pharmacist if you have questions. COMMON BRAND NAME(S): Augmentin What should I tell my care team before I take this medication? They need to know if you have any of these conditions: Kidney disease Liver disease Mononucleosis Stomach or intestine problems such as colitis An unusual or allergic reaction to amoxicillin, other penicillin  or cephalosporin antibiotics, clavulanic acid, other medications, foods, dyes, or preservatives Pregnant or trying to get pregnant Breast-feeding How should I use this medication? Take this medication by mouth. Take it as directed on the prescription label at the same time every day. Take it with food at the start of a meal or snack. Take all of this medication unless your care team tells you to stop it early. Keep taking it even if you think you are better. Talk to your care team about the use of this medication in children. While it may be prescribed for selected conditions, precautions do apply. Overdosage: If you think you have taken too much of this medicine contact a poison control center or emergency room at once. NOTE: This medicine is only for you. Do not share this medicine with others. What if I miss a dose? If you miss a dose, take it as soon as you can. If it is almost time for your next dose, take only that dose. Do not take double or extra doses. What may interact with this medication? Allopurinol Anticoagulants Birth control pills Methotrexate Probenecid This list may not describe all possible interactions. Give your health care provider a list of all the medicines, herbs, non-prescription drugs, or dietary supplements you use. Also tell them if you smoke, drink alcohol, or use illegal drugs. Some items may interact with your medicine. What should I watch for while using this medication? Tell your care team if your symptoms do not start to get better or if they get worse. This medication may cause serious skin reactions. They can happen weeks to months after starting the medication. Contact your care team right away if you notice fevers or flu-like symptoms with a rash. The rash may be red or purple and then  turn into blisters or peeling of the skin. Or, you might notice a red rash with swelling of the face, lips or lymph nodes in your neck or under your arms. Do not treat  diarrhea with over the counter products. Contact your care team if you have diarrhea that lasts more than 2 days or if it is severe and watery. If you have diabetes, you may get a false-positive result for sugar in your urine. Check with your care team. Birth control may not work properly while you are taking this medication. Talk to your care team about using an extra method of birth control. What side effects may I notice from receiving this medication? Side effects that you should report to your care team as soon as possible: Allergic reactions-skin rash, itching, hives, swelling of the face, lips, tongue, or throat Liver injury-right upper belly pain, loss of appetite, nausea, light-colored stool, dark yellow or brown urine, yellowing skin or eyes, unusual weakness or fatigue Redness, blistering, peeling, or loosening of the skin, including inside the mouth Severe diarrhea, fever Unusual vaginal discharge, itching, or odor Side effects that usually do not require medical attention (report to your care team if they continue or are bothersome): Diarrhea Nausea Vomiting This list may not describe all possible side effects. Call your doctor for medical advice about side effects. You may report side effects to FDA at 1-800-FDA-1088. Where should I keep my medication? Keep out of the reach of children and pets. Store at room temperature between 20 and 25 degrees C (68 and 77 degrees F). Throw away any unused medication after the expiration date. NOTE: This sheet is a summary. It may not cover all possible information. If you have questions about this medicine, talk to your doctor, pharmacist, or health care provider.  2022 Elsevier/Gold Standard (2019-11-11 09:45:03) Sinusitis, Adult Sinusitis is soreness and swelling (inflammation) of your sinuses. Sinuses are hollow spaces in the bones around your face. They are located: Around your eyes. In the middle of your forehead. Behind your nose. In  your cheekbones. Your sinuses and nasal passages are lined with a fluid called mucus. Mucus drains out of your sinuses. Swelling can trap mucus in your sinuses. This lets germs (bacteria, virus, or fungus) grow, which leads to infection. Most of the time, this condition is caused by a virus. What are the causes? This condition is caused by: Allergies. Asthma. Germs. Things that block your nose or sinuses. Growths in the nose (nasal polyps). Chemicals or irritants in the air. Fungus (rare). What increases the risk? You are more likely to develop this condition if: You have a weak body defense system (immune system). You do a lot of swimming or diving. You use nasal sprays too much. You smoke. What are the signs or symptoms? The main symptoms of this condition are pain and a feeling of pressure around the sinuses. Other symptoms include: Stuffy nose (congestion). Runny nose (drainage). Swelling and warmth in the sinuses. Headache. Toothache. A cough that may get worse at night. Mucus that collects in the throat or the back of the nose (postnasal drip). Being unable to smell and taste. Being very tired (fatigue). A fever. Sore throat. Bad breath. How is this diagnosed? This condition is diagnosed based on: Your symptoms. Your medical history. A physical exam. Tests to find out if your condition is short-term (acute) or long-term (chronic). Your doctor may: Check your nose for growths (polyps). Check your sinuses using a tool that has a light (endoscope).  Check for allergies or germs. Do imaging tests, such as an MRI or CT scan. How is this treated? Treatment for this condition depends on the cause and whether it is short-term or long-term. If caused by a virus, your symptoms should go away on their own within 10 days. You may be given medicines to relieve symptoms. They include: Medicines that shrink swollen tissue in the nose. Medicines that treat allergies  (antihistamines). A spray that treats swelling of the nostrils.  Rinses that help get rid of thick mucus in your nose (nasal saline washes). If caused by bacteria, your doctor may wait to see if you will get better without treatment. You may be given antibiotic medicine if you have: A very bad infection. A weak body defense system. If caused by growths in the nose, you may need to have surgery. Follow these instructions at home: Medicines Take, use, or apply over-the-counter and prescription medicines only as told by your doctor. These may include nasal sprays. If you were prescribed an antibiotic medicine, take it as told by your doctor. Do not stop taking the antibiotic even if you start to feel better. Hydrate and humidify  Drink enough water to keep your pee (urine) pale yellow. Use a cool mist humidifier to keep the humidity level in your home above 50%. Breathe in steam for 10-15 minutes, 3-4 times a day, or as told by your doctor. You can do this in the bathroom while a hot shower is running. Try not to spend time in cool or dry air. Rest Rest as much as you can. Sleep with your head raised (elevated). Make sure you get enough sleep each night. General instructions  Put a warm, moist washcloth on your face 3-4 times a day, or as often as told by your doctor. This will help with discomfort. Wash your hands often with soap and water. If there is no soap and water, use hand sanitizer. Do not smoke. Avoid being around people who are smoking (secondhand smoke). Keep all follow-up visits as told by your doctor. This is important. Contact a doctor if: You have a fever. Your symptoms get worse. Your symptoms do not get better within 10 days. Get help right away if: You have a very bad headache. You cannot stop throwing up (vomiting). You have very bad pain or swelling around your face or eyes. You have trouble seeing. You feel confused. Your neck is stiff. You have trouble  breathing. Summary Sinusitis is swelling of your sinuses. Sinuses are hollow spaces in the bones around your face. This condition is caused by tissues in your nose that become inflamed or swollen. This traps germs. These can lead to infection. If you were prescribed an antibiotic medicine, take it as told by your doctor. Do not stop taking it even if you start to feel better. Keep all follow-up visits as told by your doctor. This is important. This information is not intended to replace advice given to you by your health care provider. Make sure you discuss any questions you have with your health care provider. Document Revised: 05/20/2017 Document Reviewed: 05/20/2017 Elsevier Patient Education  2022 [employer].

## 2020-11-17 ENCOUNTER — Ambulatory Visit: Payer: Self-pay

## 2020-11-17 ENCOUNTER — Other Ambulatory Visit: Payer: Self-pay

## 2020-11-17 DIAGNOSIS — Z23 Encounter for immunization: Secondary | ICD-10-CM

## 2021-01-03 ENCOUNTER — Ambulatory Visit: Payer: BC Managed Care – PPO | Admitting: Dermatology

## 2021-01-24 ENCOUNTER — Other Ambulatory Visit: Payer: Self-pay

## 2021-01-24 ENCOUNTER — Ambulatory Visit: Payer: BC Managed Care – PPO | Admitting: Dermatology

## 2021-01-24 DIAGNOSIS — D2222 Melanocytic nevi of left ear and external auricular canal: Secondary | ICD-10-CM

## 2021-01-24 DIAGNOSIS — L814 Other melanin hyperpigmentation: Secondary | ICD-10-CM

## 2021-01-24 DIAGNOSIS — D2372 Other benign neoplasm of skin of left lower limb, including hip: Secondary | ICD-10-CM

## 2021-01-24 DIAGNOSIS — D1800 Hemangioma unspecified site: Secondary | ICD-10-CM

## 2021-01-24 DIAGNOSIS — L821 Other seborrheic keratosis: Secondary | ICD-10-CM

## 2021-01-24 DIAGNOSIS — D225 Melanocytic nevi of trunk: Secondary | ICD-10-CM | POA: Diagnosis not present

## 2021-01-24 DIAGNOSIS — D229 Melanocytic nevi, unspecified: Secondary | ICD-10-CM

## 2021-01-24 DIAGNOSIS — L578 Other skin changes due to chronic exposure to nonionizing radiation: Secondary | ICD-10-CM

## 2021-01-24 DIAGNOSIS — L579 Skin changes due to chronic exposure to nonionizing radiation, unspecified: Secondary | ICD-10-CM | POA: Diagnosis not present

## 2021-01-24 DIAGNOSIS — Z1283 Encounter for screening for malignant neoplasm of skin: Secondary | ICD-10-CM | POA: Diagnosis not present

## 2021-01-24 DIAGNOSIS — L219 Seborrheic dermatitis, unspecified: Secondary | ICD-10-CM

## 2021-01-24 NOTE — Progress Notes (Signed)
Follow-Up Visit   Subjective  Catherine Fields is a 36 y.o. female who presents for the following: Follow-up (Patient here today for 1 year tbse. ).  The patient presents for Total-Body Skin Exam (TBSE) for skin cancer screening and mole check.  The patient has spots, moles and lesions to be evaluated, some may be new or changing and the patient has concerns that these could be cancer.  Spots have not changed.  She has had an itchy scalp.   The following portions of the chart were reviewed this encounter and updated as appropriate:      Review of Systems: No other skin or systemic complaints except as noted in HPI or Assessment and Plan.   Objective  Well appearing patient in no apparent distress; mood and affect are within normal limits.  A full examination was performed including scalp, head, eyes, ears, nose, lips, neck, chest, axillae, abdomen, back, buttocks, bilateral upper extremities, bilateral lower extremities, hands, feet, fingers, toes, fingernails, and toenails. All findings within normal limits unless otherwise noted below.  right upper abdomen 2 mm medium dark brown macule with notch   Left Flank 2 mm medium dark brown macule   Left antihelix 2.5 mm gray brown macule          Left Occipital Scalp Mild erythema on left occipital scalp, itchy per pt   Assessment & Plan  Nevus (3) right upper abdomen; Left Flank; Left antihelix  Benign-appearing.  Observation.  Call clinic for new or changing lesions.  Recommend daily use of broad spectrum spf 30+ sunscreen to sun-exposed areas.    Seborrheic dermatitis Left Occipital Scalp  Seborrheic Dermatitis  -  is a chronic persistent rash characterized by pinkness and scaling most commonly of the mid face but also can occur on the scalp (dandruff), ears; mid chest, mid back and groin.  It tends to be exacerbated by stress and cooler weather.  People who have neurologic disease may experience new onset or  exacerbation of existing seborrheic dermatitis.  The condition is not curable but treatable and can be controlled.  Recommend washing hair more frequently and use medicated OTC shampoo, like H&S Recommend trying otc Scalpicin 1 % scalp solution apply to affected areas 1 - 2 times daily as needed itch. Or head and shoulders leave in scalp mist prn itch.     Lentigines - Scattered tan macules - Due to sun exposure - Benign-appearing, observe - Recommend daily broad spectrum sunscreen SPF 30+ to sun-exposed areas, reapply every 2 hours as needed. - Call for any changes  Seborrheic Keratoses - Stuck-on, waxy, tan-brown papules and/or plaques  - Benign-appearing - Discussed benign etiology and prognosis. - Observe - Call for any changes  Melanocytic Nevi - Tan-brown and/or pink-flesh-colored symmetric macules and papules - Benign appearing on exam today - Observation - Call clinic for new or changing moles - Recommend daily use of broad spectrum spf 30+ sunscreen to sun-exposed areas.   Dermatofibroma- bx proven - Firm pink/brown papulenodule with dimple sign at left upper pretibia - Benign appearing - Call for any changes  Hemangiomas - Red papules - Discussed benign nature - Observe - Call for any changes  Actinic Damage - Chronic condition, secondary to cumulative UV/sun exposure - diffuse scaly erythematous macules with underlying dyspigmentation - Recommend daily broad spectrum sunscreen SPF 30+ to sun-exposed areas, reapply every 2 hours as needed.  - Staying in the shade or wearing long sleeves, sun glasses (UVA+UVB protection) and wide brim hats (  4-inch brim around the entire circumference of the hat) are also recommended for sun protection.  - Call for new or changing lesions.  Skin cancer screening performed today. Return for 1 year tbse . I, Ruthell Rummage, CMA, am acting as scribe for Brendolyn Patty, MD.  Documentation: I have reviewed the above documentation for  accuracy and completeness, and I agree with the above.  Brendolyn Patty MD

## 2021-01-24 NOTE — Patient Instructions (Addendum)
For Scalp  Can try (otc)  head and shoulder leave in scalp mist for itch  Can also try (Otc) scalpacin 1 % solution apply topically to itchy areas of scalp as needed.    Melanoma ABCDEs  Melanoma is the most dangerous type of skin cancer, and is the leading cause of death from skin disease.  You are more likely to develop melanoma if you: Have light-colored skin, light-colored eyes, or red or blond hair Spend a lot of time in the sun Tan regularly, either outdoors or in a tanning bed Have had blistering sunburns, especially during childhood Have a close family member who has had a melanoma Have atypical moles or large birthmarks  Early detection of melanoma is key since treatment is typically straightforward and cure rates are extremely high if we catch it early.   The first sign of melanoma is often a change in a mole or a new dark spot.  The ABCDE system is a way of remembering the signs of melanoma.  A for asymmetry:  The two halves do not match. B for border:  The edges of the growth are irregular. C for color:  A mixture of colors are present instead of an even brown color. D for diameter:  Melanomas are usually (but not always) greater than 60mm - the size of a pencil eraser. E for evolution:  The spot keeps changing in size, shape, and color.  Please check your skin once per month between visits. You can use a small mirror in front and a large mirror behind you to keep an eye on the back side or your body.   If you see any new or changing lesions before your next follow-up, please call to schedule a visit.  Please continue daily skin protection including broad spectrum sunscreen SPF 30+ to sun-exposed areas, reapplying every 2 hours as needed when you're outdoors.   Staying in the shade or wearing long sleeves, sun glasses (UVA+UVB protection) and wide brim hats (4-inch brim around the entire circumference of the hat) are also recommended for sun protection.     If You  Need Anything After Your Visit  If you have any questions or concerns for your doctor, please call our main line at [phone] and press option 4 to reach your doctor's medical assistant. If no one answers, please leave a voicemail as directed and we will return your call as soon as possible. Messages left after 4 pm will be answered the following business day.   You may also send [place] a message via [place]. We typically respond to MyChart messages within 1-2 business days.  For prescription refills, please ask your pharmacy to contact our office. Our fax number is [phone].  If you have an urgent issue when the clinic is closed that cannot wait until the next business day, you can page your doctor at the number below.    Please note that while we do our best to be available for urgent issues outside of office hours, we are not available 24/7.   If you have an urgent issue and are unable to reach [place], you may choose to seek medical care at your doctor's office, retail clinic, urgent care center, or emergency room.  If you have a medical emergency, please immediately call 911 or go to the emergency department.  Pager Numbers  - Dr. Nehemiah Massed: [phone]  - Dr. Laurence Ferrari: [phone]  - Dr. Nicole Kindred: [phone]  In the event of inclement weather, please call our  main line at [phone] for an update on the status of any delays or closures.  Dermatology Medication Tips: Please keep the boxes that topical medications come in in order to help keep track of the instructions about where and how to use these. Pharmacies typically print the medication instructions only on the boxes and not directly on the medication tubes.   If your medication is too expensive, please contact our office at [phone] option 4 or send [place] a message through [place].   We are unable to tell what your co-pay for medications will be in advance as this is different depending on your insurance coverage. However,  we may be able to find a substitute medication at lower cost or fill out paperwork to get insurance to cover a needed medication.   If a prior authorization is required to get your medication covered by your insurance company, please allow [place] 1-2 business days to complete this process.  Drug prices often vary depending on where the prescription is filled and some pharmacies may offer cheaper prices.  The website www.goodrx.com contains coupons for medications through different pharmacies. The prices here do not account for what the cost may be with help from insurance (it may be cheaper with your insurance), but the website can give you the price if you did not use any insurance.  - You can print the associated coupon and take it with your prescription to the pharmacy.  - You may also stop by our office during regular business hours and pick up a GoodRx coupon card.  - If you need your prescription sent electronically to a different pharmacy, notify our office through [facility] or by phone at [phone] option 4.     Si Usted Necesita Algo Despus de Su Visita  Tambin puede enviarnos un mensaje a travs de Pharmacist, community. Por lo general respondemos a los mensajes de MyChart en el transcurso de 1 a 2 das hbiles.  Para renovar recetas, por favor pida a su farmacia que se ponga en contacto con nuestra oficina. Harland Dingwall de fax es [place] [phone].  Si tiene un asunto urgente cuando la clnica est cerrada y que no puede esperar hasta el siguiente da hbil, puede llamar/localizar a su doctor(a) al nmero que aparece a continuacin.   Por favor, tenga en cuenta que aunque hacemos todo lo posible para estar disponibles para asuntos urgentes fuera del horario de [place], no estamos disponibles las 24 horas del da, los 7 das de la [place].   Si tiene un problema urgente y no puede comunicarse con nosotros, puede optar por buscar atencin mdica  en el consultorio de su doctor(a), en una  clnica privada, en un centro de atencin urgente o en una sala de emergencias.  Si tiene Engineering geologist, por favor llame inmediatamente al 911 o vaya a la sala de emergencias.  Nmeros de bper  - Dr. Nehemiah Massed: [phone]  - Dra. Moye: [phone]  - Dra. Nicole Kindred: [phone]  En caso de inclemencias del [place], por favor llame a Johnsie Kindred principal al [phone] para una actualizacin sobre el [place] de cualquier retraso o cierre.  Consejos para la medicacin en dermatologa: Por favor, guarde las cajas en las que vienen los medicamentos de uso tpico para ayudarle a seguir las instrucciones sobre dnde y cmo usarlos. Las farmacias generalmente imprimen las instrucciones del medicamento slo en las cajas y no directamente en los tubos del [place].   Si su medicamento es muy caro, por favor, pngase  en contacto con nuestra oficina llamando al [phone] y presione la opcin 4 o envenos un mensaje a travs de Pharmacist, community.   No podemos decirle cul ser su copago por los medicamentos por adelantado ya que esto es diferente dependiendo de la cobertura de su seguro. Sin embargo, es posible que podamos encontrar un medicamento sustituto a Electrical engineer un formulario para que el seguro cubra el medicamento que se considera necesario.   Si se requiere una autorizacin previa para que su compaa de seguros [place] su medicamento, por favor permtanos de 1 a 2 das hbiles para completar este proceso.  Los precios de los medicamentos varan con frecuencia dependiendo del Environmental consultant de dnde se surte la receta y alguna farmacias pueden ofrecer precios ms baratos.  El sitio web www.goodrx.com tiene cupones para medicamentos de Airline pilot. Los precios aqu no tienen en cuenta lo que podra costar con la ayuda del seguro (puede ser ms barato con su seguro), pero el sitio web puede darle el precio si no utiliz Research scientist (physical sciences).  - Puede imprimir el cupn correspondiente y  llevarlo con su receta a la farmacia.  - Tambin puede pasar por nuestra oficina durante el horario de atencin regular y Charity fundraiser una tarjeta de cupones de GoodRx.  - Si necesita que su receta se enve electrnicamente a una farmacia diferente, informe a nuestra oficina a travs de MyChart de [place] o por telfono llamando al [phone] y presione la opcin 4.

## 2021-02-08 ENCOUNTER — Telehealth: Payer: Self-pay

## 2021-02-08 NOTE — Telephone Encounter (Signed)
LM to schedule new patient appt per Dr Nicki Reaper.

## 2021-05-03 ENCOUNTER — Ambulatory Visit: Payer: BC Managed Care – PPO | Admitting: Podiatry

## 2021-05-05 ENCOUNTER — Encounter: Payer: Self-pay | Admitting: Podiatry

## 2021-05-05 ENCOUNTER — Ambulatory Visit (INDEPENDENT_AMBULATORY_CARE_PROVIDER_SITE_OTHER): Payer: BC Managed Care – PPO

## 2021-05-05 ENCOUNTER — Ambulatory Visit: Payer: BC Managed Care – PPO | Admitting: Podiatry

## 2021-05-05 DIAGNOSIS — M722 Plantar fascial fibromatosis: Secondary | ICD-10-CM

## 2021-05-05 DIAGNOSIS — M778 Other enthesopathies, not elsewhere classified: Secondary | ICD-10-CM | POA: Diagnosis not present

## 2021-05-05 MED ORDER — BETAMETHASONE SOD PHOS & ACET 6 (3-3) MG/ML IJ SUSP
3.0000 mg | Freq: Once | INTRAMUSCULAR | Status: AC
  Administered 2021-05-05: 3 mg via INTRA_ARTICULAR

## 2021-05-05 MED ORDER — MELOXICAM 15 MG PO TABS: 15.0000 mg | ORAL_TABLET | Freq: Every day | ORAL | 1 refills | Status: DC

## 2021-05-05 MED ORDER — METHYLPREDNISOLONE 4 MG PO TBPK: ORAL_TABLET | ORAL | 0 refills | Status: DC

## 2021-05-05 NOTE — Progress Notes (Signed)
? ?  HPI: 36 y.o. female presenting today as a reestablish new patient for evaluation of left midfoot pain that began about 2 weeks ago.  Sudden onset.  Patient states that her foot fell asleep and when she stood up it folded underneath her leg.  She has had pain and tenderness to the midportion of the left foot ever since.  She also states that after the incident she was squatting down and she felt a pop in her heel.  Currently she has not done anything for treatment.  She wears [place] tennis shoes ? ?Past Medical History:  ?Diagnosis Date  ? Asthma   ? Asthma without status asthmaticus   ? Chicken pox   ? GERD (gastroesophageal reflux disease)   ? Hyperkalemia   ? Kidney stones   ? RUQ pain   ? Sleep apnea   ? ? ?Past Surgical History:  ?Procedure Laterality Date  ? ESOPHAGOGASTRODUODENOSCOPY (EGD) WITH PROPOFOL N/A 02/07/2015  ? Procedure: ESOPHAGOGASTRODUODENOSCOPY (EGD) WITH PROPOFOL;  Surgeon: Josefine Class, MD;  Location: [facility] ENDOSCOPY;  Service: Endoscopy;  Laterality: N/A;  ? ? ?Allergies  ?Allergen Reactions  ? Ciprofloxacin   ?  Calf pain and muscle aches  ? Sulfonamide Derivatives   ? Sulfa Antibiotics Rash  ? ?  ?Physical Exam: ?General: The patient is alert and oriented x3 in no acute distress. ? ?Dermatology: Skin is warm, dry and supple bilateral lower extremities. Negative for open lesions or macerations. ? ?Vascular: Palpable pedal pulses bilaterally. Capillary refill within normal limits.  Negative for any significant edema or erythema ? ?Neurological: Light touch and protective threshold grossly intact ? ?Musculoskeletal Exam: No pedal deformities noted.  Tenderness to palpation noted along the fourth TMT joint left foot.  There is no tenderness to the plantar heel however the patient does feel a pulling sensation when dorsiflexed ? ?Radiographic Exam:  ?Normal osseous mineralization. Joint spaces preserved. No fracture/dislocation/boney destruction.   ? ?Assessment: ?1.  Capsulitis fourth TMT  left ?2.  Plantar heel sprain/strain ? ? ?Plan of Care:  ?1. Patient evaluated. X-Rays reviewed.  ?2.  Injection of 0.5 cc Celestone Soluspan injected along the fourth TMT left foot ?3.  Prescription for Medrol Dosepak ?4.  Prescription for meloxicam 15 mg daily after completion of the Dosepak ?5.  Patient declined cam boot.  She is able to tolerate walking in her tennis shoes.  Continue wearing good supportive shoes and sneakers ?6.  If there is no significant improvement due to the given history of injury we will order MRI of the left foot. ?7.  Return to clinic as needed ? ?*Psychologist, occupational.  Also an Optometrist. ? ?  ?  ?Edrick Kins, DPM ?[place] ? ?Dr. Edrick Kins, DPM  ?  ?[address]                                        ?[place], [place] [address]                ?Office [phone]  ?Fax [phone] ? ? ? ? ?

## 2021-05-08 ENCOUNTER — Ambulatory Visit: Payer: BC Managed Care – PPO | Admitting: Internal Medicine

## 2021-05-08 ENCOUNTER — Encounter: Payer: Self-pay | Admitting: Internal Medicine

## 2021-05-08 VITALS — BP 132/80 | HR 74 | Temp 98.0°F | Resp 17 | Ht 66.0 in | Wt 314.0 lb

## 2021-05-08 DIAGNOSIS — J4530 Mild persistent asthma, uncomplicated: Secondary | ICD-10-CM

## 2021-05-08 DIAGNOSIS — R635 Abnormal weight gain: Secondary | ICD-10-CM | POA: Diagnosis not present

## 2021-05-08 DIAGNOSIS — G4730 Sleep apnea, unspecified: Secondary | ICD-10-CM

## 2021-05-08 DIAGNOSIS — K219 Gastro-esophageal reflux disease without esophagitis: Secondary | ICD-10-CM

## 2021-05-08 DIAGNOSIS — Z13220 Encounter for screening for lipoid disorders: Secondary | ICD-10-CM | POA: Diagnosis not present

## 2021-05-08 LAB — CBC WITH DIFFERENTIAL/PLATELET
Basophils Absolute: 0.1 10*3/uL (ref 0.0–0.1)
Basophils Relative: 1.2 % (ref 0.0–3.0)
Eosinophils Absolute: 0.3 10*3/uL (ref 0.0–0.7)
Eosinophils Relative: 2.3 % (ref 0.0–5.0)
HCT: 42 % (ref 36.0–46.0)
Hemoglobin: 13.4 g/dL (ref 12.0–15.0)
Lymphocytes Relative: 28.7 % (ref 12.0–46.0)
Lymphs Abs: 3.7 10*3/uL (ref 0.7–4.0)
MCHC: 32 g/dL (ref 30.0–36.0)
MCV: 82.4 fl (ref 78.0–100.0)
Monocytes Absolute: 0.8 10*3/uL (ref 0.1–1.0)
Monocytes Relative: 6.2 % (ref 3.0–12.0)
Neutro Abs: 7.9 10*3/uL — ABNORMAL HIGH (ref 1.4–7.7)
Neutrophils Relative %: 61.6 % (ref 43.0–77.0)
Platelets: 344 10*3/uL (ref 150.0–400.0)
RBC: 5.1 Mil/uL (ref 3.87–5.11)
RDW: 14.3 % (ref 11.5–15.5)
WBC: 12.9 10*3/uL — ABNORMAL HIGH (ref 4.0–10.5)

## 2021-05-08 LAB — LIPID PANEL
Cholesterol: 184 mg/dL (ref 0–200)
HDL: 50.7 mg/dL (ref >39.00)
LDL Cholesterol: 113 mg/dL — ABNORMAL HIGH (ref 0–99)
NonHDL: 133.66
Total CHOL/HDL Ratio: 4
Triglycerides: 105 mg/dL (ref 0.0–149.0)
VLDL: 21 mg/dL (ref 0.0–40.0)

## 2021-05-08 LAB — COMPREHENSIVE METABOLIC PANEL
ALT: 19 U/L (ref 0–35)
AST: 15 U/L (ref 0–37)
Albumin: 4 g/dL (ref 3.5–5.2)
Alkaline Phosphatase: 77 U/L (ref 39–117)
BUN: 16 mg/dL (ref 6–23)
CO2: 26 mEq/L (ref 19–32)
Calcium: 8.9 mg/dL (ref 8.4–10.5)
Chloride: 103 mEq/L (ref 96–112)
Creatinine, Ser: 0.81 mg/dL (ref 0.40–1.20)
GFR: 93.25 mL/min (ref >60.00)
Glucose, Bld: 89 mg/dL (ref 70–99)
Potassium: 4 mEq/L (ref 3.5–5.1)
Sodium: 138 mEq/L (ref 135–145)
Total Bilirubin: 0.5 mg/dL (ref 0.2–1.2)
Total Protein: 7.1 g/dL (ref 6.0–8.3)

## 2021-05-08 NOTE — Progress Notes (Signed)
Patient ID: Catherine Fields, female   DOB: [birth year], 36 y.o.   MRN: [id number] ? ? ?Subjective:  ? ? Patient ID: Catherine Fields, female    DOB: [birth year], 36 y.o.   MRN: [id number] ? ?This visit occurred during the SARS-CoV-2 public health emergency.  Safety protocols were in place, including screening questions prior to the visit, additional usage of staff PPE, and extensive cleaning of exam room while observing appropriate contact time as indicated for disinfecting solutions.  ? ?Patient here for establish care.  .  ? ?HPI ?Daughter of Tennis Ship.  Has OSA.  CPAP.  Has been a while since checked.  Not sure if correct.  Discussed f/u sleep study or auto titration. Diagnosis - asthma.  Symbicort.  History of allergy shots.  Tries to stay active.  No chest pain or sob reported.  Had EGD 02/2015 - normal.  Takes TUMS prn. May take qod.  Notices occasional heart fluttering.  Periods q 29 days.  May last three days.  LMP 4 weeks ago.  Previously had pelvic and pap smears with Dr Georga Bora.  Never had abnormal.   ? ? ?Past Medical History:  ?Diagnosis Date  ? Asthma   ? Asthma without status asthmaticus   ? Chicken pox   ? GERD (gastroesophageal reflux disease)   ? Hyperkalemia   ? Kidney stones   ? RUQ pain   ? Sleep apnea   ? ?Past Surgical History:  ?Procedure Laterality Date  ? ESOPHAGOGASTRODUODENOSCOPY (EGD) WITH PROPOFOL N/A 02/07/2015  ? Procedure: ESOPHAGOGASTRODUODENOSCOPY (EGD) WITH PROPOFOL;  Surgeon: Josefine Class, MD;  Location: [facility] ENDOSCOPY;  Service: Endoscopy;  Laterality: N/A;  ? ?Family History  ?Problem Relation Age of Onset  ? Asthma Mother   ? Depression Mother   ? Obesity Mother   ? Arthritis Mother   ? Heart attack Mother   ? Heart disease Mother   ? Hyperlipidemia Father   ? Depression Father   ? Diabetes Father   ? Hearing loss Father   ? Hypertension Father   ? Mental illness Father   ? Cancer Maternal Grandmother   ? Bone cancer Maternal Grandfather   ? ?Social History   ? ?Socioeconomic History  ? Marital status: Married  ?  Spouse name: Not on file  ? Number of children: Not on file  ? Years of education: Not on file  ? Highest education level: Not on file  ?Occupational History  ? Not on file  ?Tobacco Use  ? Smoking status: Never  ? Smokeless tobacco: Never  ?Substance and Sexual Activity  ? Alcohol use: Yes  ?  Comment: RARELY  ? Drug use: Never  ? Sexual activity: Yes  ?Other Topics Concern  ? Not on file  ?Social History Narrative  ? Not on file  ? ?Social Determinants of Health  ? ?Financial Resource Strain: Not on file  ?Food Insecurity: Not on file  ?Transportation Needs: Not on file  ?Physical Activity: Not on file  ?Stress: Not on file  ?Social Connections: Not on file  ? ? ? ?Review of Systems  ?Constitutional:  Negative for appetite change and unexpected weight change.  ?HENT:  Negative for congestion and sinus pressure.   ?Respiratory:  Negative for cough, chest tightness and shortness of breath.   ?Cardiovascular:  Positive for palpitations. Negative for chest pain and leg swelling.  ?     No increased swelling.   ?Gastrointestinal:  Negative for abdominal pain, diarrhea, nausea and vomiting.  ?  Some acid reflux as outlined.  TUMS.   ?Genitourinary:  Negative for difficulty urinating and dysuria.  ?Musculoskeletal:  Negative for joint swelling and myalgias.  ?Skin:  Negative for color change and rash.  ?Neurological:  Negative for dizziness, light-headedness and headaches.  ?Psychiatric/Behavioral:  Negative for agitation and dysphoric mood.   ? ?   ?Objective:  ?  ? ?BP 132/80 (BP Location: Left Arm, Patient Position: Sitting, Cuff Size: Large)   Pulse 74   Temp 98 ?F (36.7 ?C) (Temporal)   Resp 17   Ht $R'5\' 6"'JJ$  (1.676 m)   Wt (!) 314 lb (142.4 kg)   SpO2 99%   BMI 50.68 kg/m?  ?Wt Readings from Last 3 Encounters:  ?05/08/21 (!) 314 lb (142.4 kg)  ?04/22/18 266 lb 9.6 oz (120.9 kg)  ?11/12/17 271 lb 9.6 oz (123.2 kg)  ? ? ?Physical Exam ?Vitals reviewed.   ?Constitutional:   ?   General: She is not in acute distress. ?   Appearance: Normal appearance.  ?HENT:  ?   Head: Normocephalic and atraumatic.  ?   Right Ear: External ear normal.  ?   Left Ear: External ear normal.  ?Eyes:  ?   General: No scleral icterus.    ?   Right eye: No discharge.     ?   Left eye: No discharge.  ?   Conjunctiva/sclera: Conjunctivae normal.  ?Neck:  ?   Thyroid: No thyromegaly.  ?Cardiovascular:  ?   Rate and Rhythm: Normal rate and regular rhythm.  ?Pulmonary:  ?   Effort: No respiratory distress.  ?   Breath sounds: Normal breath sounds. No wheezing.  ?Abdominal:  ?   General: Bowel sounds are normal.  ?   Palpations: Abdomen is soft.  ?   Tenderness: There is no abdominal tenderness.  ?Musculoskeletal:     ?   General: No swelling or tenderness.  ?   Cervical back: Neck supple. No tenderness.  ?Lymphadenopathy:  ?   Cervical: No cervical adenopathy.  ?Skin: ?   Findings: No erythema or rash.  ?Neurological:  ?   Mental Status: She is alert.  ?Psychiatric:     ?   Mood and Affect: Mood normal.     ?   Behavior: Behavior normal.  ? ? ? ?Outpatient Encounter Medications as of 05/08/2021  ?Medication Sig  ? [DISCONTINUED] meloxicam (MOBIC) 15 MG tablet Take 1 tablet (15 mg total) by mouth daily. (Patient not taking: Reported on 05/08/2021)  ? [DISCONTINUED] methylPREDNISolone (MEDROL DOSEPAK) 4 MG TBPK tablet 6 day dose pack - take as directed (Patient not taking: Reported on 05/08/2021)  ? ?No facility-administered encounter medications on file as of 05/08/2021.  ?  ? ?Lab Results  ?Component Value Date  ? WBC 12.9 (H) 05/08/2021  ? HGB 13.4 05/08/2021  ? HCT 42.0 05/08/2021  ? PLT 344.0 05/08/2021  ? GLUCOSE 89 05/08/2021  ? CHOL 184 05/08/2021  ? TRIG 105.0 05/08/2021  ? HDL 50.70 05/08/2021  ? LDLCALC 113 (H) 05/08/2021  ? ALT 19 05/08/2021  ? AST 15 05/08/2021  ? NA 138 05/08/2021  ? K 4.0 05/08/2021  ? CL 103 05/08/2021  ? CREATININE 0.81 05/08/2021  ? BUN 16 05/08/2021  ? CO2 26 05/08/2021   ? TSH 4.94 05/08/2021  ? ? ?   ?Assessment & Plan:  ? ?Problem List Items Addressed This Visit   ? ? GERD (gastroesophageal reflux disease)  ?  Takes TUMS.  Consider trial of  pepcid.  May help with palpitations.  S/p EGD 2017.  Follow.  ? ?  ?  ? Mild persistent asthma  ?  Feels breathing stable.  Has used symbicort previously.  Refer back to pulmonary as outlined for evaluation - sleep apnea.   ? ?  ?  ? Relevant Orders  ? Ambulatory referral to Pulmonology  ? Sleep apnea  ?  CPAP.  Refer to pulmonary for evaluation.  Confirm correct settings, etc.  F/u regarding asthma as well.  ? ?  ?  ? Relevant Orders  ? Ambulatory referral to Pulmonology  ? Weight gain  ?  Discussed weight gain.  F/u with treatment - sleep apnea.  Check routine labs including cbc, met c and tsh.  Low carb diet and exercise.   ? ?  ?  ? Relevant Orders  ? CBC with Differential/Platelet (Completed)  ? Comprehensive metabolic panel (Completed)  ? TSH (Completed)  ? ?Other Visit Diagnoses   ? ? Screening cholesterol level    -  Primary  ? Relevant Orders  ? Lipid panel (Completed)  ? ?  ? ? ? ?Einar Pheasant, MD  ?

## 2021-05-09 LAB — TSH: TSH: 4.94 u[IU]/mL (ref 0.35–5.50)

## 2021-05-10 ENCOUNTER — Other Ambulatory Visit: Payer: Self-pay

## 2021-05-10 DIAGNOSIS — D72829 Elevated white blood cell count, unspecified: Secondary | ICD-10-CM

## 2021-05-14 ENCOUNTER — Encounter: Payer: Self-pay | Admitting: Internal Medicine

## 2021-05-14 DIAGNOSIS — G4730 Sleep apnea, unspecified: Secondary | ICD-10-CM | POA: Insufficient documentation

## 2021-05-14 NOTE — Assessment & Plan Note (Signed)
Feels breathing stable.  Has used symbicort previously.  Refer back to pulmonary as outlined for evaluation - sleep apnea.   ?

## 2021-05-14 NOTE — Assessment & Plan Note (Signed)
Discussed weight gain.  F/u with treatment - sleep apnea.  Check routine labs including cbc, met c and tsh.  Low carb diet and exercise.   ?

## 2021-05-14 NOTE — Assessment & Plan Note (Signed)
Takes TUMS.  Consider trial of pepcid.  May help with palpitations.  S/p EGD 2017.  Follow.  ?

## 2021-05-14 NOTE — Assessment & Plan Note (Signed)
CPAP.  Refer to pulmonary for evaluation.  Confirm correct settings, etc.  F/u regarding asthma as well.  ?

## 2021-06-21 ENCOUNTER — Institutional Professional Consult (permissible substitution): Payer: BC Managed Care – PPO | Admitting: Pulmonary Disease

## 2021-06-26 ENCOUNTER — Other Ambulatory Visit (INDEPENDENT_AMBULATORY_CARE_PROVIDER_SITE_OTHER): Payer: BC Managed Care – PPO

## 2021-06-26 DIAGNOSIS — D72829 Elevated white blood cell count, unspecified: Secondary | ICD-10-CM | POA: Diagnosis not present

## 2021-06-26 LAB — CBC WITH DIFFERENTIAL/PLATELET
Basophils Absolute: 0.1 10*3/uL (ref 0.0–0.1)
Basophils Relative: 0.7 % (ref 0.0–3.0)
Eosinophils Absolute: 0.3 10*3/uL (ref 0.0–0.7)
Eosinophils Relative: 2.7 % (ref 0.0–5.0)
HCT: 39.9 % (ref 36.0–46.0)
Hemoglobin: 13 g/dL (ref 12.0–15.0)
Lymphocytes Relative: 25.9 % (ref 12.0–46.0)
Lymphs Abs: 3.2 10*3/uL (ref 0.7–4.0)
MCHC: 32.6 g/dL (ref 30.0–36.0)
MCV: 82.5 fl (ref 78.0–100.0)
Monocytes Absolute: 0.8 10*3/uL (ref 0.1–1.0)
Monocytes Relative: 6.2 % (ref 3.0–12.0)
Neutro Abs: 7.9 10*3/uL — ABNORMAL HIGH (ref 1.4–7.7)
Neutrophils Relative %: 64.5 % (ref 43.0–77.0)
Platelets: 352 10*3/uL (ref 150.0–400.0)
RBC: 4.83 Mil/uL (ref 3.87–5.11)
RDW: 14.1 % (ref 11.5–15.5)
WBC: 12.3 10*3/uL — ABNORMAL HIGH (ref 4.0–10.5)

## 2021-07-20 ENCOUNTER — Encounter: Payer: Self-pay | Admitting: Pulmonary Disease

## 2021-07-20 ENCOUNTER — Ambulatory Visit: Payer: BC Managed Care – PPO | Admitting: Pulmonary Disease

## 2021-07-20 VITALS — BP 122/78 | HR 100 | Temp 97.8°F | Ht 66.0 in | Wt 316.8 lb

## 2021-07-20 DIAGNOSIS — G4733 Obstructive sleep apnea (adult) (pediatric): Secondary | ICD-10-CM

## 2021-07-20 DIAGNOSIS — G4730 Sleep apnea, unspecified: Secondary | ICD-10-CM

## 2021-07-20 DIAGNOSIS — Z9989 Dependence on other enabling machines and devices: Secondary | ICD-10-CM

## 2021-07-20 DIAGNOSIS — J4520 Mild intermittent asthma, uncomplicated: Secondary | ICD-10-CM

## 2021-07-20 DIAGNOSIS — E669 Obesity, unspecified: Secondary | ICD-10-CM

## 2021-07-20 NOTE — Patient Instructions (Signed)
Will arrange for new auto CPAP set up  Follow up in 4 months

## 2021-07-20 NOTE — Progress Notes (Signed)
[facility]  Chief Complaint  Patient presents with   sleep consult    Prior sleep study 2016    Past Surgical History:  She  has a past surgical history that includes Esophagogastroduodenoscopy (egd) with propofol (N/A, 02/07/2015).  Past Medical History:  GERD, Nephrolithiasis  Constitutional:  BP 122/78 (BP Location: Left Arm, Cuff Size: Large)   Pulse 100   Temp 97.8 F (36.6 C) (Temporal)   Ht '5\' 6"'$  (1.676 m)   Wt (!) 316 lb 12.8 oz (143.7 kg)   SpO2 96%   BMI 51.13 kg/m   Brief Summary:  Catherine Fields is a 36 y.o. female for assessment of asthma and obstructive sleep apnea.      Subjective:   She was previously seen by Dr. Ancil Linsey.  She had a sleep study in February 2016.  Per medical notes, her AHI was 30.  She was started on CPAP.  She had the same machine since then.  She was purchasing supplies from [employer].  She went back to [employer] after her machine stopped working effectively.  She got a Designer, multimedia, but was told she needed a new prescription to get a new machine.  She uses nasal pillow mask.  She doesn't have any trouble falling asleep or staying asleep.  She is not using anything to help sleep or stay awake.  She denies sleep walking, sleep talking, bruxism, or nightmares.  There is no history of restless legs.  She denies sleep hallucinations, sleep paralysis, or cataplexy.  She only gets trouble with asthma when she has a respiratory infection.  Hasn't needed inhalers much since she did allergy shots about 6 or 7 years ago.  No problems with her sinuses at present.  Not having skin rash, cough, sputum, or wheeze.  No food allergies.  She gets a rash from sulfa antibiotics.   Physical Exam:   Appearance - well kempt   ENMT - no sinus tenderness, no oral exudate, no LAN, Mallampati 4 airway, no stridor  Respiratory - equal breath sounds bilaterally, no wheezing or rales  CV - s1s2 regular rate and  rhythm, no murmurs  Ext - no clubbing, no edema  Skin - no rashes  Psych - normal mood and affect   Sleep Tests:    Social History:  She  reports that she has never smoked. She has never used smokeless tobacco. She reports current alcohol use. She reports that she does not use drugs.  Family History:  Her family history includes Arthritis in her mother; Asthma in her mother; Bone cancer in her maternal grandfather; Cancer in her maternal grandmother; Depression in her father and mother; Diabetes in her father; Hearing loss in her father; Heart attack in her mother; Heart disease in her mother; Hyperlipidemia in her father; Hypertension in her father; Mental illness in her father; Obesity in her mother.     Assessment/Plan:   Obstructive sleep apnea. - discussed how sleep apnea can impact her health - reviewed treatment options for sleep apnea - she is compliant with CPAP and reports benefit from therapy - she uses [employer] for her DME - will arrange for new Resmed auto CPAP 5 to 15 cm H2O  Mild, intermittent asthma. - doesn't need any specific therapy at this time - can defer further assessment for now  Obesity. - discussed how her weight can impact her sleep apnea - she will be discuss weight loss options with her PCP later this  Summer   Time Spent Involved in Patient Care on Day of Examination:  35 minutes  Follow up:   Patient Instructions  Will arrange for new auto CPAP set up  Follow up in 4 months  Medication List:   Allergies as of 07/20/2021       Reactions   Ciprofloxacin    Calf pain and muscle aches   Sulfonamide Derivatives    Sulfa Antibiotics Rash        Medication List        Accurate as of July 20, 2021 10:05 AM. If you have any questions, ask your nurse or doctor.          Flonase Allergy Relief 50 MCG/ACT nasal spray Generic drug: fluticasone as needed.        Signature:  Chesley Mires, MD [place] Pager - [phone] 07/20/2021, 10:05 AM

## 2021-08-03 DIAGNOSIS — G4733 Obstructive sleep apnea (adult) (pediatric): Secondary | ICD-10-CM | POA: Diagnosis not present

## 2021-08-08 ENCOUNTER — Encounter: Payer: Self-pay | Admitting: Internal Medicine

## 2021-08-08 ENCOUNTER — Ambulatory Visit (INDEPENDENT_AMBULATORY_CARE_PROVIDER_SITE_OTHER): Payer: BC Managed Care – PPO | Admitting: Internal Medicine

## 2021-08-08 ENCOUNTER — Other Ambulatory Visit (HOSPITAL_COMMUNITY)
Admission: RE | Admit: 2021-08-08 | Discharge: 2021-08-08 | Disposition: A | Discharge status: Home / Self Care | Payer: BC Managed Care – PPO | Source: Ambulatory Visit | Attending: Internal Medicine | Admitting: Internal Medicine

## 2021-08-08 VITALS — BP 128/86 | HR 86 | Temp 98.7°F | Resp 15 | Ht 66.0 in | Wt 313.6 lb

## 2021-08-08 DIAGNOSIS — Z124 Encounter for screening for malignant neoplasm of cervix: Secondary | ICD-10-CM

## 2021-08-08 DIAGNOSIS — J4530 Mild persistent asthma, uncomplicated: Secondary | ICD-10-CM

## 2021-08-08 DIAGNOSIS — Z0000 Encounter for general adult medical examination without abnormal findings: Secondary | ICD-10-CM | POA: Diagnosis not present

## 2021-08-08 DIAGNOSIS — G4730 Sleep apnea, unspecified: Secondary | ICD-10-CM

## 2021-08-08 DIAGNOSIS — D72829 Elevated white blood cell count, unspecified: Secondary | ICD-10-CM

## 2021-08-08 DIAGNOSIS — K219 Gastro-esophageal reflux disease without esophagitis: Secondary | ICD-10-CM

## 2021-08-08 LAB — CBC WITH DIFFERENTIAL/PLATELET
Basophils Absolute: 0.1 10*3/uL (ref 0.0–0.1)
Basophils Relative: 1 % (ref 0.0–3.0)
Eosinophils Absolute: 0.3 10*3/uL (ref 0.0–0.7)
Eosinophils Relative: 3.4 % (ref 0.0–5.0)
HCT: 43.2 % (ref 36.0–46.0)
Hemoglobin: 14 g/dL (ref 12.0–15.0)
Lymphocytes Relative: 20.1 % (ref 12.0–46.0)
Lymphs Abs: 1.9 10*3/uL (ref 0.7–4.0)
MCHC: 32.3 g/dL (ref 30.0–36.0)
MCV: 83.4 fl (ref 78.0–100.0)
Monocytes Absolute: 0.4 10*3/uL (ref 0.1–1.0)
Monocytes Relative: 4.5 % (ref 3.0–12.0)
Neutro Abs: 6.9 10*3/uL (ref 1.4–7.7)
Neutrophils Relative %: 71 % (ref 43.0–77.0)
Platelets: 388 10*3/uL (ref 150.0–400.0)
RBC: 5.18 Mil/uL — ABNORMAL HIGH (ref 3.87–5.11)
RDW: 14.3 % (ref 11.5–15.5)
WBC: 9.7 10*3/uL (ref 4.0–10.5)

## 2021-08-08 MED ORDER — PANTOPRAZOLE SODIUM 40 MG PO TBEC: 40.0000 mg | DELAYED_RELEASE_TABLET | Freq: Every day | ORAL | 3 refills | Status: DC

## 2021-08-08 NOTE — Progress Notes (Unsigned)
Patient ID: Catherine Fields, female   DOB: Dec 21, 1985, 36 y.o.   MRN: [id number]   Subjective:    Patient ID: Catherine Fields, female    DOB: 21-Oct-1985, 36 y.o.   MRN: [id number]  This visit occurred during the SARS-CoV-2 public health emergency.  Safety protocols were in place, including screening questions prior to the visit, additional usage of staff PPE, and extensive cleaning of exam room while observing appropriate contact time as indicated for disinfecting solutions.   Patient here for  Chief Complaint  Patient presents with   Annual Exam   .   HPI    Past Medical History:  Diagnosis Date   Asthma    Chicken pox    GERD (gastroesophageal reflux disease)    Hyperkalemia    Kidney stones    Sleep apnea    Past Surgical History:  Procedure Laterality Date   ESOPHAGOGASTRODUODENOSCOPY (EGD) WITH PROPOFOL N/A 02/07/2015   Procedure: ESOPHAGOGASTRODUODENOSCOPY (EGD) WITH PROPOFOL;  Surgeon: Josefine Class, MD;  Location: [facility] ENDOSCOPY;  Service: Endoscopy;  Laterality: N/A;   Family History  Problem Relation Age of Onset   Asthma Mother    Depression Mother    Obesity Mother    Arthritis Mother    Heart attack Mother    Heart disease Mother    Hyperlipidemia Father    Depression Father    Diabetes Father    Hearing loss Father    Hypertension Father    Mental illness Father    Cancer Maternal Grandmother    Bone cancer Maternal Grandfather    Social History   Socioeconomic History   Marital status: Married    Spouse name: Not on file   Number of children: Not on file   Years of education: Not on file   Highest education level: Not on file  Occupational History   Not on file  Tobacco Use   Smoking status: Never   Smokeless tobacco: Never  Substance and Sexual Activity   Alcohol use: Yes    Comment: RARELY   Drug use: Never   Sexual activity: Yes  Other Topics Concern   Not on file  Social History Narrative   Not on file   Social Determinants of  Health   Financial Resource Strain: Not on file  Food Insecurity: Not on file  Transportation Needs: Not on file  Physical Activity: Not on file  Stress: Not on file  Social Connections: Not on file     Review of Systems     Objective:     BP 128/86 (BP Location: Left Arm, Patient Position: Sitting, Cuff Size: Large)   Pulse 86   Temp 98.7 F (37.1 C) (Temporal)   Resp 15   Ht '5\' 6"'$  (1.676 m)   Wt (!) 313 lb 9.6 oz (142.2 kg)   SpO2 99%   BMI 50.62 kg/m  Wt Readings from Last 3 Encounters:  08/08/21 (!) 313 lb 9.6 oz (142.2 kg)  07/20/21 (!) 316 lb 12.8 oz (143.7 kg)  05/08/21 (!) 314 lb (142.4 kg)    Physical Exam   Outpatient Encounter Medications as of 08/08/2021  Medication Sig   fluticasone (FLONASE ALLERGY RELIEF) 50 MCG/ACT nasal spray as needed.   No facility-administered encounter medications on file as of 08/08/2021.     Lab Results  Component Value Date   WBC 12.3 (H) 06/26/2021   HGB 13.0 06/26/2021   HCT 39.9 06/26/2021   PLT 352.0 06/26/2021   GLUCOSE 89 05/08/2021  CHOL 184 05/08/2021   TRIG 105.0 05/08/2021   HDL 50.70 05/08/2021   LDLCALC 113 (H) 05/08/2021   ALT 19 05/08/2021   AST 15 05/08/2021   NA 138 05/08/2021   K 4.0 05/08/2021   CL 103 05/08/2021   CREATININE 0.81 05/08/2021   BUN 16 05/08/2021   CO2 26 05/08/2021   TSH 4.94 05/08/2021       Assessment & Plan:   Problem List Items Addressed This Visit   None Visit Diagnoses     Screening for cervical cancer    -  Primary   Relevant Orders   Cytology - PAP( [place])        Einar Pheasant, MD

## 2021-08-09 DIAGNOSIS — D72829 Elevated white blood cell count, unspecified: Secondary | ICD-10-CM | POA: Insufficient documentation

## 2021-08-09 DIAGNOSIS — Z0000 Encounter for general adult medical examination without abnormal findings: Secondary | ICD-10-CM | POA: Insufficient documentation

## 2021-08-09 LAB — CYTOLOGY - PAP
Adequacy: ABSENT
Comment: NEGATIVE
Diagnosis: NEGATIVE
High risk HPV: NEGATIVE

## 2021-08-09 NOTE — Assessment & Plan Note (Signed)
Takes TUMS.  Increased acid reflux.  Has been on protonix previously.  Restart protonix '40mg'$  q day.  Follow.  get her back in soon to reassess.  S/p EGD 2017.

## 2021-08-09 NOTE — Assessment & Plan Note (Signed)
White blood cell count elevated last check.  Recheck cbc today.

## 2021-08-09 NOTE — Assessment & Plan Note (Signed)
Physical today 08/08/21.  PAP 08/08/21.  Discussed mammogram.

## 2021-08-09 NOTE — Assessment & Plan Note (Signed)
Feels breathing stable.  Has used symbicort previously.   

## 2021-08-09 NOTE — Assessment & Plan Note (Signed)
Saw Dr Halford Chessman.  New machine - auto cpap 5-20.  Follow.

## 2021-09-03 DIAGNOSIS — G4733 Obstructive sleep apnea (adult) (pediatric): Secondary | ICD-10-CM | POA: Diagnosis not present

## 2021-10-03 DIAGNOSIS — G4733 Obstructive sleep apnea (adult) (pediatric): Secondary | ICD-10-CM | POA: Diagnosis not present

## 2021-10-10 ENCOUNTER — Ambulatory Visit: Payer: BC Managed Care – PPO | Admitting: Internal Medicine

## 2021-11-03 DIAGNOSIS — G4733 Obstructive sleep apnea (adult) (pediatric): Secondary | ICD-10-CM | POA: Diagnosis not present

## 2021-11-22 ENCOUNTER — Telehealth: Payer: Self-pay

## 2021-11-22 ENCOUNTER — Ambulatory Visit: Payer: BC Managed Care – PPO | Admitting: Internal Medicine

## 2021-11-22 ENCOUNTER — Encounter: Payer: Self-pay | Admitting: Internal Medicine

## 2021-11-22 VITALS — BP 124/74 | HR 74 | Temp 98.7°F | Resp 15 | Ht 66.0 in | Wt 312.6 lb

## 2021-11-22 DIAGNOSIS — Z6841 Body Mass Index (BMI) 40.0 and over, adult: Secondary | ICD-10-CM

## 2021-11-22 DIAGNOSIS — G4730 Sleep apnea, unspecified: Secondary | ICD-10-CM | POA: Diagnosis not present

## 2021-11-22 DIAGNOSIS — R051 Acute cough: Secondary | ICD-10-CM

## 2021-11-22 DIAGNOSIS — K219 Gastro-esophageal reflux disease without esophagitis: Secondary | ICD-10-CM | POA: Diagnosis not present

## 2021-11-22 MED ORDER — WEGOVY 0.25 MG/0.5ML ~~LOC~~ SOAJ
0.2500 mg | SUBCUTANEOUS | 1 refills | Status: DC
  Filled 2022-07-19 – 2022-07-25 (×2): qty 2, 28d supply, fill #0

## 2021-11-22 MED ORDER — PANTOPRAZOLE SODIUM 40 MG PO TBEC: 40.0000 mg | DELAYED_RELEASE_TABLET | Freq: Every day | ORAL | 1 refills | Status: DC

## 2021-11-22 NOTE — Progress Notes (Signed)
Patient ID: Catherine Fields, female   DOB: [birth year], 36 y.o.   MRN: [id number]   Subjective:    Patient ID: Catherine Fields, female    DOB: [birth year], 36 y.o.   MRN: [id number]  Patient here for  Chief Complaint  Patient presents with   Follow-up   Gastroesophageal Reflux   .   HPI Here to follow up regarding acid reflux. Was started on protonix last visit.  Took for a period of time and symptoms resolved.  Not needing now.  Has decreased intake of soda. Also has OSA - using CPAP regularly.  Doing well with cpap. Does report cough and congestion.  No sinus pressure.  No chest pain or sob.  No abdominal pain.  No acid reflux.  Is concerned regarding weight.  Is interested in starting wegovy.  Discussed diet and exercise.  Discussed wegovy and possible side effects.  Denies family history of neuroendocrine tumors.    Past Medical History:  Diagnosis Date   Asthma    Chicken pox    GERD (gastroesophageal reflux disease)    Hyperkalemia    Kidney stones    Sleep apnea    Past Surgical History:  Procedure Laterality Date   ESOPHAGOGASTRODUODENOSCOPY (EGD) WITH PROPOFOL N/A 02/07/2015   Procedure: ESOPHAGOGASTRODUODENOSCOPY (EGD) WITH PROPOFOL;  Surgeon: Josefine Class, MD;  Location: [facility] ENDOSCOPY;  Service: Endoscopy;  Laterality: N/A;   Family History  Problem Relation Age of Onset   Asthma Mother    Depression Mother    Obesity Mother    Arthritis Mother    Heart attack Mother    Heart disease Mother    Hyperlipidemia Father    Depression Father    Diabetes Father    Hearing loss Father    Hypertension Father    Mental illness Father    Cancer Maternal Grandmother    Bone cancer Maternal Grandfather    Social History   Socioeconomic History   Marital status: Married    Spouse name: Not on file   Number of children: Not on file   Years of education: Not on file   Highest education level: Not on file  Occupational History   Not on file  Tobacco Use   Smoking  status: Never   Smokeless tobacco: Never  Substance and Sexual Activity   Alcohol use: Yes    Comment: RARELY   Drug use: Never   Sexual activity: Yes  Other Topics Concern   Not on file  Social History Narrative   Not on file   Social Determinants of Health   Financial Resource Strain: Not on file  Food Insecurity: Not on file  Transportation Needs: Not on file  Physical Activity: Not on file  Stress: Not on file  Social Connections: Not on file     Review of Systems  Constitutional:  Negative for appetite change and unexpected weight change.  HENT:  Negative for congestion and sinus pressure.   Respiratory:  Positive for cough. Negative for chest tightness and shortness of breath.   Cardiovascular:  Negative for chest pain, palpitations and leg swelling.  Gastrointestinal:  Negative for abdominal pain, diarrhea, nausea and vomiting.  Genitourinary:  Negative for difficulty urinating and dysuria.  Musculoskeletal:  Negative for joint swelling and myalgias.  Skin:  Negative for color change and rash.  Neurological:  Negative for dizziness and headaches.  Psychiatric/Behavioral:  Negative for agitation and dysphoric mood.        Objective:     BP  124/74 (BP Location: Left Arm, Patient Position: Sitting, Cuff Size: Large)   Pulse 74   Temp 98.7 F (37.1 C) (Temporal)   Resp 15   Ht '5\' 6"'$  (1.676 m)   Wt (!) 312 lb 9.6 oz (141.8 kg)   SpO2 98%   BMI 50.45 kg/m  Wt Readings from Last 3 Encounters:  11/22/21 (!) 312 lb 9.6 oz (141.8 kg)  08/08/21 (!) 313 lb 9.6 oz (142.2 kg)  07/20/21 (!) 316 lb 12.8 oz (143.7 kg)    Physical Exam Vitals reviewed.  Constitutional:      General: She is not in acute distress.    Appearance: Normal appearance.  HENT:     Head: Normocephalic and atraumatic.     Right Ear: External ear normal.     Left Ear: External ear normal.  Eyes:     General: No scleral icterus.       Right eye: No discharge.        Left eye: No  discharge.     Conjunctiva/sclera: Conjunctivae normal.  Neck:     Thyroid: No thyromegaly.  Cardiovascular:     Rate and Rhythm: Normal rate and regular rhythm.  Pulmonary:     Effort: No respiratory distress.     Breath sounds: Normal breath sounds. No wheezing.  Abdominal:     General: Bowel sounds are normal.     Palpations: Abdomen is soft.     Tenderness: There is no abdominal tenderness.  Musculoskeletal:        General: No swelling or tenderness.     Cervical back: Neck supple. No tenderness.  Lymphadenopathy:     Cervical: No cervical adenopathy.  Skin:    Findings: No erythema or rash.  Neurological:     Mental Status: She is alert.  Psychiatric:        Mood and Affect: Mood normal.        Behavior: Behavior normal.      Outpatient Encounter Medications as of 11/22/2021  Medication Sig   fluticasone (FLONASE ALLERGY RELIEF) 50 MCG/ACT nasal spray as needed.   Semaglutide-Weight Management (WEGOVY) 0.25 MG/0.5ML SOAJ Inject 0.25 mg into the skin once a week.   [DISCONTINUED] pantoprazole (PROTONIX) 40 MG tablet Take 1 tablet (40 mg total) by mouth daily.   pantoprazole (PROTONIX) 40 MG tablet Take 1 tablet (40 mg total) by mouth daily.   No facility-administered encounter medications on file as of 11/22/2021.     Lab Results  Component Value Date   WBC 9.7 08/08/2021   HGB 14.0 08/08/2021   HCT 43.2 08/08/2021   PLT 388.0 08/08/2021   GLUCOSE 89 05/08/2021   CHOL 184 05/08/2021   TRIG 105.0 05/08/2021   HDL 50.70 05/08/2021   LDLCALC 113 (H) 05/08/2021   ALT 19 05/08/2021   AST 15 05/08/2021   NA 138 05/08/2021   K 4.0 05/08/2021   CL 103 05/08/2021   CREATININE 0.81 05/08/2021   BUN 16 05/08/2021   CO2 26 05/08/2021   TSH 4.94 05/08/2021       Assessment & Plan:   Problem List Items Addressed This Visit     Class 3 severe obesity with body mass index (BMI) of 50.0 to 59.9 in adult [facility])    Discussed diet and exercise.  Discussed treatment  options.  She is interested in starting wegovy.  Discussed possible side effects.  Will start low dose wegovy.  Call with update.  Follow.  Relevant Medications   Semaglutide-Weight Management (WEGOVY) 0.25 MG/0.5ML SOAJ   Cough    Cough and congestion as outlined.  Lungs clear.  Robitussin DM as directed.  If nasal congestion/drainage - steroid nasal spray and saline.  Follow.  Call with update.       GERD (gastroesophageal reflux disease) - Primary    Off protonix now.  No acid reflux.  Follow.       Relevant Medications   pantoprazole (PROTONIX) 40 MG tablet   Sleep apnea    Continue cpap.  Doing well.          Einar Pheasant, MD

## 2021-11-22 NOTE — Telephone Encounter (Signed)
HILARIA TITSWORTH (Key: [id number]) Rx #: [id number] VVOHYW 0.'25MG'$ /0.5ML auto-injectors   Form [employer] [place] Commercial Electronic Request Form (CB) Created 6 hours ago Sent to Plan 6 hours ago Plan Response 6 hours ago Submit Clinical Questions 6 hours ago Determination Wait for Determination Please wait for [employer] to return a determination.

## 2021-11-24 ENCOUNTER — Encounter: Payer: Self-pay | Admitting: Internal Medicine

## 2021-11-24 DIAGNOSIS — R059 Cough, unspecified: Secondary | ICD-10-CM | POA: Insufficient documentation

## 2021-11-24 NOTE — Assessment & Plan Note (Signed)
Continue cpap.  Doing well.

## 2021-11-24 NOTE — Assessment & Plan Note (Signed)
Discussed diet and exercise.  Discussed treatment options.  She is interested in starting wegovy.  Discussed possible side effects.  Will start low dose wegovy.  Call with update.  Follow.

## 2021-11-24 NOTE — Assessment & Plan Note (Signed)
Off protonix now.  No acid reflux.  Follow.

## 2021-11-24 NOTE — Assessment & Plan Note (Signed)
Cough and congestion as outlined.  Lungs clear.  Robitussin DM as directed.  If nasal congestion/drainage - steroid nasal spray and saline.  Follow.  Call with update.

## 2021-11-30 ENCOUNTER — Encounter: Payer: Self-pay | Admitting: Nurse Practitioner

## 2021-11-30 ENCOUNTER — Ambulatory Visit (INDEPENDENT_AMBULATORY_CARE_PROVIDER_SITE_OTHER): Payer: Self-pay | Admitting: Nurse Practitioner

## 2021-11-30 ENCOUNTER — Telehealth: Payer: Self-pay

## 2021-11-30 VITALS — BP 120/82 | HR 99 | Temp 98.7°F | Ht 66.0 in | Wt 318.0 lb

## 2021-11-30 DIAGNOSIS — J0110 Acute frontal sinusitis, unspecified: Secondary | ICD-10-CM

## 2021-11-30 DIAGNOSIS — T3695XA Adverse effect of unspecified systemic antibiotic, initial encounter: Secondary | ICD-10-CM

## 2021-11-30 DIAGNOSIS — B379 Candidiasis, unspecified: Secondary | ICD-10-CM

## 2021-11-30 MED ORDER — FLUTICASONE PROPIONATE 50 MCG/ACT NA SUSP: 2.0000 | Freq: Every day | NASAL | 6 refills | Status: DC

## 2021-11-30 MED ORDER — AMOXICILLIN-POT CLAVULANATE 875-125 MG PO TABS: 1.0000 | ORAL_TABLET | Freq: Two times a day (BID) | ORAL | 0 refills | Status: AC

## 2021-11-30 MED ORDER — FLUCONAZOLE 150 MG PO TABS: 150.0000 mg | ORAL_TABLET | Freq: Once | ORAL | 0 refills | Status: AC

## 2021-11-30 NOTE — Progress Notes (Signed)
[facility] [address], [place] [address] [phone]  Office Visit Note  Patient Name: Catherine Fields Date of Birth [id number]  Medical Record number [id number]  Date of Service: 11/30/2021  Chief Complaint  Patient presents with   Sinusitis    Started 1 week ago with a cough, post nasal drip, yesterday felt like sinus infection starting. Mucus was bloody, green, white. ST that has has started today. Has not done a COVID test.      HPI She has had PND with sinus drainage and ore pressure for the past week. Worsening in the past 24 hours.   Darkened mucous last night.   Denies fever  Noticed more lymph node enlargement in cervical region   She used flonase last night   Current Medication:  Outpatient Encounter Medications as of 11/30/2021  Medication Sig   pantoprazole (PROTONIX) 40 MG tablet Take 1 tablet (40 mg total) by mouth daily.   fluticasone (FLONASE ALLERGY RELIEF) 50 MCG/ACT nasal spray as needed. (Patient not taking: Reported on 11/30/2021)   Semaglutide-Weight Management (WEGOVY) 0.25 MG/0.5ML SOAJ Inject 0.25 mg into the skin once a week.   No facility-administered encounter medications on file as of 11/30/2021.      Medical History: Past Medical History:  Diagnosis Date   Asthma    Chicken pox    GERD (gastroesophageal reflux disease)    Hyperkalemia    Kidney stones    Sleep apnea      Vital Signs: BP 120/82 (BP Location: Left Arm, Patient Position: Sitting, Cuff Size: Large)   Pulse 99   Temp 98.7 F (37.1 C) (Tympanic)   Ht '5\' 6"'$  (1.676 m)   Wt (!) 318 lb (144.2 kg)   SpO2 100%   BMI 51.33 kg/m    Review of Systems  Constitutional: Negative.   HENT:  Positive for congestion and sinus pressure.   Eyes: Negative.   Respiratory:  Positive for cough.   Cardiovascular: Negative.   Endocrine: Negative.   Genitourinary: Negative.   Musculoskeletal: Negative.   Neurological: Negative.   Hematological: Negative.      Physical Exam HENT:     Head: Normocephalic.     Right Ear: Tympanic membrane, ear canal and external ear normal.     Left Ear: Tympanic membrane, ear canal and external ear normal.     Nose: Congestion and rhinorrhea present.     Mouth/Throat:     Mouth: Mucous membranes are moist.  Eyes:     Pupils: Pupils are equal, round, and reactive to light.  Cardiovascular:     Rate and Rhythm: Normal rate and regular rhythm.     Heart sounds: Normal heart sounds.  Pulmonary:     Effort: Pulmonary effort is normal.     Breath sounds: Normal breath sounds.  Skin:    General: Skin is warm.  Neurological:     General: No focal deficit present.     Mental Status: She is alert.  Psychiatric:        Mood and Affect: Mood normal.       Assessment/Plan: 1. Acute non-recurrent frontal sinusitis  - amoxicillin-clavulanate (AUGMENTIN) 875-125 MG tablet; Take 1 tablet by mouth 2 (two) times daily for 7 days. Take with food  Dispense: 14 tablet; Refill: 0 - fluticasone (FLONASE) 50 MCG/ACT nasal spray; Place 2 sprays into both nostrils daily.  Dispense: 16 g; Refill: 6  2. Antibiotic-induced yeast infection  - fluconazole (DIFLUCAN) 150 MG tablet; Take 1  tablet (150 mg total) by mouth once for 1 dose.  Dispense: 1 tablet; Refill: 0    General Counseling: Kaneshia verbalizes understanding of the findings of todays visit and agrees with plan of treatment. I have discussed any further diagnostic evaluation that may be needed or ordered today. We also reviewed her medications today. she has been encouraged to call the office with any questions or concerns that should arise related to todays visit.    Time spent:20 Minutes   Apolonio Schneiders [facility] Family Nurse Practitioner

## 2021-11-30 NOTE — Telephone Encounter (Signed)
Catherine Fields (Key: [id number]) Rx #: [id number] 0.'25MG'$ /0.5ML auto-injectors   Form [employer] [place] Commercial Electronic Request Form (CB) Created 8 days ago Sent to Plan 8 days ago Plan Response 8 days ago Submit Clinical Questions 8 days ago Determination Favorable 8 days ago Your prior authorization for [name] has been approved! MORE INFO Personalized support and financial assistance may be available through the [employer] program. For more information, and to see program requirements, click on the More Info button to the right.  Message from plan: Effective from 11/22/2021 through 03/27/2022. Please note: This medication is to be titrated up in strength every 4 weeks as tolerated by the member. The quantity limit set of 4 pens per 180 days allows for this titration through all strengths using 4 pens of each strength every 28 days.

## 2021-11-30 NOTE — Telephone Encounter (Signed)
Mychart msg sent to pt to advise wegovy approval

## 2021-12-03 DIAGNOSIS — G4733 Obstructive sleep apnea (adult) (pediatric): Secondary | ICD-10-CM | POA: Diagnosis not present

## 2021-12-18 DIAGNOSIS — G4733 Obstructive sleep apnea (adult) (pediatric): Secondary | ICD-10-CM | POA: Diagnosis not present

## 2022-01-03 DIAGNOSIS — G4733 Obstructive sleep apnea (adult) (pediatric): Secondary | ICD-10-CM | POA: Diagnosis not present

## 2022-01-05 ENCOUNTER — Ambulatory Visit: Payer: BC Managed Care – PPO | Admitting: Pulmonary Disease

## 2022-01-05 ENCOUNTER — Encounter: Payer: Self-pay | Admitting: Pulmonary Disease

## 2022-01-05 VITALS — BP 124/72 | HR 87 | Temp 97.8°F | Ht 65.0 in | Wt 318.4 lb

## 2022-01-05 DIAGNOSIS — E669 Obesity, unspecified: Secondary | ICD-10-CM

## 2022-01-05 DIAGNOSIS — G4733 Obstructive sleep apnea (adult) (pediatric): Secondary | ICD-10-CM | POA: Diagnosis not present

## 2022-01-05 DIAGNOSIS — G4730 Sleep apnea, unspecified: Secondary | ICD-10-CM

## 2022-01-05 NOTE — Patient Instructions (Signed)
Follow up in 1 year.

## 2022-01-05 NOTE — Progress Notes (Signed)
[facility]  Chief Complaint  Patient presents with   Follow-up    Wearing cpap nightly- pressure and mask is okay. No concerns.     Past Surgical History:  She  has a past surgical history that includes Esophagogastroduodenoscopy (egd) with propofol (N/A, 02/07/2015).  Past Medical History:  GERD, Nephrolithiasis, Asthma  Constitutional:  BP 124/72 (BP Location: Left Wrist, Cuff Size: Normal)   Pulse 87   Temp 97.8 F (36.6 C) (Temporal)   Ht '5\' 5"'$  (1.651 m)   Wt (!) 318 lb 6.4 oz (144.4 kg)   SpO2 100%   BMI 52.98 kg/m   Brief Summary:  Catherine Fields is a 37 y.o. female with obstructive sleep apnea.      Subjective:   She is doing much better since she got her new CPAP.  Energy level is better.  Uses nasal pillow mask.  No issue with mask fit.  Not having sinus congestion, sore throat, or dry mouth.    She was being assessed for bariatric surgery, but her insurance wouldn't cover this and she was concerned about risks of surgery.  She was approved to start wegovy, but hasn't been able to get script filled yet.  She is hoping to get her weight down to 200 - 215 lbs.   Physical Exam:   Appearance - well kempt   ENMT - no sinus tenderness, no oral exudate, no LAN, Mallampati 4 airway, no stridor  Respiratory - equal breath sounds bilaterally, no wheezing or rales  CV - s1s2 regular rate and rhythm, no murmurs  Ext - no clubbing, no edema  Skin - no rashes  Psych - normal mood and affect   Sleep Tests:  PSG Feb 2016 >> AHI 30 Auto CPAP 12/05/21 to 01/03/22 >> used on 30 of 30 nights with average 7 hrs 37 min.  Average AHI 0.3 with median CPAP 9 and 95 th percentile CPAP 12 cm H2O  Social History:  She  reports that she has never smoked. She has never used smokeless tobacco. She reports current alcohol use. She reports that she does not use drugs.  Family History:  Her family history includes Arthritis in her  mother; Asthma in her mother; Bone cancer in her maternal grandfather; Cancer in her maternal grandmother; Depression in her father and mother; Diabetes in her father; Hearing loss in her father; Heart attack in her mother; Heart disease in her mother; Hyperlipidemia in her father; Hypertension in her father; Mental illness in her father; Obesity in her mother.     Assessment/Plan:   Obstructive sleep apnea. - she is compliant with CPAP and reports benefit from therapy - she uses [employer] for her DME - her current CPAP was ordered in July 2023 - continue auto CPAP 5 to 15 cm H2O  Obesity. - she will be starting wegovy in the near future when this is available - discussed that we could revisit CPAP therapy once she gets closer to her goal weight   Time Spent Involved in Patient Care on Day of Examination:  25 minutes  Follow up:   Patient Instructions  Follow up in 1 year  Medication List:   Allergies as of 01/05/2022       Reactions   Ciprofloxacin    Calf pain and muscle aches   Sulfonamide Derivatives    Sulfa Antibiotics Rash        Medication List        Accurate  as of January 05, 2022  8:45 AM. If you have any questions, ask your nurse or doctor.          Flonase Allergy Relief 50 MCG/ACT nasal spray Generic drug: fluticasone as needed.   pantoprazole 40 MG tablet Commonly known as: PROTONIX Take 1 tablet (40 mg total) by mouth daily.   Wegovy 0.25 MG/0.5ML Soaj Generic drug: Semaglutide-Weight Management Inject 0.25 mg into the skin once a week.        Signature:  Chesley Mires, MD [place] Pager - [phone] 01/05/2022, 8:45 AM

## 2022-01-22 ENCOUNTER — Encounter: Payer: Self-pay | Admitting: Internal Medicine

## 2022-01-24 ENCOUNTER — Encounter: Payer: Self-pay | Admitting: Internal Medicine

## 2022-01-24 ENCOUNTER — Ambulatory Visit: Payer: BC Managed Care – PPO | Admitting: Internal Medicine

## 2022-01-24 VITALS — BP 128/78 | HR 84 | Temp 98.0°F | Resp 16 | Ht 66.0 in | Wt 313.0 lb

## 2022-01-24 DIAGNOSIS — Z6841 Body Mass Index (BMI) 40.0 and over, adult: Secondary | ICD-10-CM

## 2022-01-24 DIAGNOSIS — J4530 Mild persistent asthma, uncomplicated: Secondary | ICD-10-CM | POA: Diagnosis not present

## 2022-01-24 DIAGNOSIS — G4730 Sleep apnea, unspecified: Secondary | ICD-10-CM | POA: Diagnosis not present

## 2022-01-24 NOTE — Progress Notes (Signed)
Subjective:    Patient ID: Catherine Fields, female    DOB: [birth year], 37 y.o.   MRN: [id number]  Patient here for  Chief Complaint  Patient presents with   Medical Management of Chronic Issues    HPI Here to follow up regarding weight issues, reflux and sleep apnea.  Using cpap regularly.  Doing well with this.  Has lost some weight.  Discussed diet and exercise.  Discussed medication options.  Was approved for wegovy.  Has been unable to get the medication.  Discussed saxenda.  Want to avoid stimulants. No chest pain or sob reported.  No abdominal pain or bowel change reported.   Past Medical History:  Diagnosis Date   Asthma    Chicken pox    GERD (gastroesophageal reflux disease)    Hyperkalemia    Kidney stones    Sleep apnea    Past Surgical History:  Procedure Laterality Date   ESOPHAGOGASTRODUODENOSCOPY (EGD) WITH PROPOFOL N/A 02/07/2015   Procedure: ESOPHAGOGASTRODUODENOSCOPY (EGD) WITH PROPOFOL;  Surgeon: Josefine Class, MD;  Location: [facility] ENDOSCOPY;  Service: Endoscopy;  Laterality: N/A;   Family History  Problem Relation Age of Onset   Asthma Mother    Depression Mother    Obesity Mother    Arthritis Mother    Heart attack Mother    Heart disease Mother    Hyperlipidemia Father    Depression Father    Diabetes Father    Hearing loss Father    Hypertension Father    Mental illness Father    Cancer Maternal Grandmother    Bone cancer Maternal Grandfather    Social History   Socioeconomic History   Marital status: Married    Spouse name: Not on file   Number of children: Not on file   Years of education: Not on file   Highest education level: Not on file  Occupational History   Not on file  Tobacco Use   Smoking status: Never   Smokeless tobacco: Never  Substance and Sexual Activity   Alcohol use: Yes    Comment: RARELY   Drug use: Never   Sexual activity: Yes  Other Topics Concern   Not on file  Social History Narrative   Not on file    Social Determinants of Health   Financial Resource Strain: Not on file  Food Insecurity: Not on file  Transportation Needs: Not on file  Physical Activity: Not on file  Stress: Not on file  Social Connections: Not on file     Review of Systems  Constitutional:  Negative for appetite change and unexpected weight change.  HENT:  Negative for congestion and sinus pressure.   Respiratory:  Negative for cough, chest tightness and shortness of breath.   Cardiovascular:  Negative for chest pain, palpitations and leg swelling.  Gastrointestinal:  Negative for abdominal pain, diarrhea, nausea and vomiting.  Genitourinary:  Negative for difficulty urinating and dysuria.  Musculoskeletal:  Negative for joint swelling and myalgias.  Skin:  Negative for color change and rash.  Neurological:  Negative for dizziness and headaches.  Psychiatric/Behavioral:  Negative for agitation and dysphoric mood.        Objective:     BP 128/78   Pulse 84   Temp 98 F (36.7 C)   Resp 16   Ht '5\' 6"'$  (1.676 m)   Wt (!) 313 lb (142 kg)   SpO2 99%   BMI 50.52 kg/m  Wt Readings from Last 3 Encounters:  01/24/22 Marland Kitchen)  313 lb (142 kg)  01/05/22 (!) 318 lb 6.4 oz (144.4 kg)  11/30/21 (!) 318 lb (144.2 kg)    Physical Exam Vitals reviewed.  Constitutional:      General: She is not in acute distress.    Appearance: Normal appearance.  HENT:     Head: Normocephalic and atraumatic.     Right Ear: External ear normal.     Left Ear: External ear normal.  Eyes:     General: No scleral icterus.       Right eye: No discharge.        Left eye: No discharge.     Conjunctiva/sclera: Conjunctivae normal.  Neck:     Thyroid: No thyromegaly.  Cardiovascular:     Rate and Rhythm: Normal rate and regular rhythm.  Pulmonary:     Effort: No respiratory distress.     Breath sounds: Normal breath sounds. No wheezing.  Abdominal:     General: Bowel sounds are normal.     Palpations: Abdomen is soft.      Tenderness: There is no abdominal tenderness.  Musculoskeletal:        General: No swelling or tenderness.     Cervical back: Neck supple. No tenderness.  Lymphadenopathy:     Cervical: No cervical adenopathy.  Skin:    Findings: No erythema or rash.  Neurological:     Mental Status: She is alert.  Psychiatric:        Mood and Affect: Mood normal.        Behavior: Behavior normal.      Outpatient Encounter Medications as of 01/24/2022  Medication Sig   fluticasone (FLONASE ALLERGY RELIEF) 50 MCG/ACT nasal spray as needed.   pantoprazole (PROTONIX) 40 MG tablet Take 1 tablet (40 mg total) by mouth daily.   Semaglutide-Weight Management (WEGOVY) 0.25 MG/0.5ML SOAJ Inject 0.25 mg into the skin once a week.   No facility-administered encounter medications on file as of 01/24/2022.     Lab Results  Component Value Date   WBC 9.7 08/08/2021   HGB 14.0 08/08/2021   HCT 43.2 08/08/2021   PLT 388.0 08/08/2021   GLUCOSE 89 05/08/2021   CHOL 184 05/08/2021   TRIG 105.0 05/08/2021   HDL 50.70 05/08/2021   LDLCALC 113 (H) 05/08/2021   ALT 19 05/08/2021   AST 15 05/08/2021   NA 138 05/08/2021   K 4.0 05/08/2021   CL 103 05/08/2021   CREATININE 0.81 05/08/2021   BUN 16 05/08/2021   CO2 26 05/08/2021   TSH 4.94 05/08/2021    No results found.     Assessment & Plan:  Mild persistent asthma, unspecified whether complicated Assessment & Plan: Feels breathing stable.  Has used symbicort previously.     Sleep apnea, unspecified type Assessment & Plan: Saw Dr Halford Chessman - auto cpap - 5-20.    Class 3 severe obesity without serious comorbidity with body mass index (BMI) of 50.0 to 59.9 in adult, unspecified obesity type [facility]) Assessment & Plan: Discussed diet and exercise.  Discussed treatment options.  She was interested in starting wegovy.  Has been approved.  Unable to get at the pharmacy.  Avoid stimulants.  Discussed saxenda.  Will see if available and if covered.        Einar Pheasant, MD

## 2022-01-28 ENCOUNTER — Encounter: Payer: Self-pay | Admitting: Internal Medicine

## 2022-01-28 NOTE — Assessment & Plan Note (Signed)
Feels breathing stable.  Has used symbicort previously.

## 2022-01-28 NOTE — Assessment & Plan Note (Signed)
Discussed diet and exercise.  Discussed treatment options.  She was interested in starting wegovy.  Has been approved.  Unable to get at the pharmacy.  Avoid stimulants.  Discussed saxenda.  Will see if available and if covered.

## 2022-01-28 NOTE — Assessment & Plan Note (Signed)
Saw Dr Halford Chessman - auto cpap - 5-20.

## 2022-01-30 ENCOUNTER — Ambulatory Visit: Payer: BC Managed Care – PPO | Admitting: Dermatology

## 2022-01-30 VITALS — BP 133/94 | HR 101

## 2022-01-30 DIAGNOSIS — D2372 Other benign neoplasm of skin of left lower limb, including hip: Secondary | ICD-10-CM

## 2022-01-30 DIAGNOSIS — Z1283 Encounter for screening for malignant neoplasm of skin: Secondary | ICD-10-CM | POA: Diagnosis not present

## 2022-01-30 DIAGNOSIS — D2222 Melanocytic nevi of left ear and external auricular canal: Secondary | ICD-10-CM

## 2022-01-30 DIAGNOSIS — D225 Melanocytic nevi of trunk: Secondary | ICD-10-CM | POA: Diagnosis not present

## 2022-01-30 DIAGNOSIS — D2239 Melanocytic nevi of other parts of face: Secondary | ICD-10-CM | POA: Diagnosis not present

## 2022-01-30 DIAGNOSIS — D229 Melanocytic nevi, unspecified: Secondary | ICD-10-CM

## 2022-01-30 DIAGNOSIS — L814 Other melanin hyperpigmentation: Secondary | ICD-10-CM

## 2022-01-30 DIAGNOSIS — L578 Other skin changes due to chronic exposure to nonionizing radiation: Secondary | ICD-10-CM

## 2022-01-30 DIAGNOSIS — L821 Other seborrheic keratosis: Secondary | ICD-10-CM

## 2022-01-30 NOTE — Patient Instructions (Addendum)
     Melanoma ABCDEs  Melanoma is the most dangerous type of skin cancer, and is the leading cause of death from skin disease.  You are more likely to develop melanoma if you: Have light-colored skin, light-colored eyes, or red or blond hair Spend a lot of time in the sun Tan regularly, either outdoors or in a tanning bed Have had blistering sunburns, especially during childhood Have a close family member who has had a melanoma Have atypical moles or large birthmarks  Early detection of melanoma is key since treatment is typically straightforward and cure rates are extremely high if we catch it early.   The first sign of melanoma is often a change in a mole or a new dark spot.  The ABCDE system is a way of remembering the signs of melanoma.  A for asymmetry:  The two halves do not match. B for border:  The edges of the growth are irregular. C for color:  A mixture of colors are present instead of an even brown color. D for diameter:  Melanomas are usually (but not always) greater than 6mm - the size of a pencil eraser. E for evolution:  The spot keeps changing in size, shape, and color.  Please check your skin once per month between visits. You can use a small mirror in front and a large mirror behind you to keep an eye on the back side or your body.   If you see any new or changing lesions before your next follow-up, please call to schedule a visit.  Please continue daily skin protection including broad spectrum sunscreen SPF 30+ to sun-exposed areas, reapplying every 2 hours as needed when you're outdoors.   Staying in the shade or wearing long sleeves, sun glasses (UVA+UVB protection) and wide brim hats (4-inch brim around the entire circumference of the hat) are also recommended for sun protection.    Due to recent changes in healthcare laws, you may see results of your pathology and/or laboratory studies on MyChart before the doctors have had a chance to review them. We  understand that in some cases there may be results that are confusing or concerning to you. Please understand that not all results are received at the same time and often the doctors may need to interpret multiple results in order to provide you with the best plan of care or course of treatment. Therefore, we ask that you please give us 2 business days to thoroughly review all your results before contacting the office for clarification. Should we see a critical lab result, you will be contacted sooner.   If You Need Anything After Your Visit  If you have any questions or concerns for your doctor, please call our main line at [phone] and press option 4 to reach your doctor's medical assistant. If no one answers, please leave a voicemail as directed and we will return your call as soon as possible. Messages left after 4 pm will be answered the following business day.   You may also send us a message via MyChart. We typically respond to MyChart messages within 1-2 business days.  For prescription refills, please ask your pharmacy to contact our office. Our fax number is [phone].  If you have an urgent issue when the clinic is closed that cannot wait until the next business day, you can page your doctor at the number below.    Please note that while we do our best to be available for urgent issues   outside of office hours, we are not available 24/7.   If you have an urgent issue and are unable to reach us, you may choose to seek medical care at your doctor's office, retail clinic, urgent care center, or emergency room.  If you have a medical emergency, please immediately call 911 or go to the emergency department.  Pager Numbers  - Dr. Kowalski: [phone]  - Dr. Moye: [phone]  - Dr. Stewart: [phone]  In the event of inclement weather, please call our main line at [phone] for an update on the status of any delays or closures.  Dermatology Medication Tips: Please  keep the boxes that topical medications come in in order to help keep track of the instructions about where and how to use these. Pharmacies typically print the medication instructions only on the boxes and not directly on the medication tubes.   If your medication is too expensive, please contact our office at [phone] option 4 or send us a message through MyChart.   We are unable to tell what your co-pay for medications will be in advance as this is different depending on your insurance coverage. However, we may be able to find a substitute medication at lower cost or fill out paperwork to get insurance to cover a needed medication.   If a prior authorization is required to get your medication covered by your insurance company, please allow us 1-2 business days to complete this process.  Drug prices often vary depending on where the prescription is filled and some pharmacies may offer cheaper prices.  The website www.goodrx.com contains coupons for medications through different pharmacies. The prices here do not account for what the cost may be with help from insurance (it may be cheaper with your insurance), but the website can give you the price if you did not use any insurance.  - You can print the associated coupon and take it with your prescription to the pharmacy.  - You may also stop by our office during regular business hours and pick up a GoodRx coupon card.  - If you need your prescription sent electronically to a different pharmacy, notify our office through [facility] MyChart or by phone at [phone] option 4.     Si Usted Necesita Algo Despus de Su Visita  Tambin puede enviarnos un mensaje a travs de MyChart. Por lo general respondemos a los mensajes de MyChart en el transcurso de 1 a 2 das hbiles.  Para renovar recetas, por favor pida a su farmacia que se ponga en contacto con nuestra oficina. Nuestro nmero de fax es el [phone].  Si tiene un asunto urgente  cuando la clnica est cerrada y que no puede esperar hasta el siguiente da hbil, puede llamar/localizar a su doctor(a) al nmero que aparece a continuacin.   Por favor, tenga en cuenta que aunque hacemos todo lo posible para estar disponibles para asuntos urgentes fuera del horario de oficina, no estamos disponibles las 24 horas del da, los 7 das de la semana.   Si tiene un problema urgente y no puede comunicarse con nosotros, puede optar por buscar atencin mdica  en el consultorio de su doctor(a), en una clnica privada, en un centro de atencin urgente o en una sala de emergencias.  Si tiene una emergencia mdica, por favor llame inmediatamente al 911 o vaya a la sala de emergencias.  Nmeros de bper  - Dr. Kowalski: [phone]  - Dra. Moye: [phone]  - Dra. Stewart: [phone]  En caso   de inclemencias del tiempo, por favor llame a nuestra lnea principal al [phone] para una actualizacin sobre el estado de cualquier retraso o cierre.  Consejos para la medicacin en dermatologa: Por favor, guarde las cajas en las que vienen los medicamentos de uso tpico para ayudarle a seguir las instrucciones sobre dnde y cmo usarlos. Las farmacias generalmente imprimen las instrucciones del medicamento slo en las cajas y no directamente en los tubos del medicamento.   Si su medicamento es muy caro, por favor, pngase en contacto con nuestra oficina llamando al [phone] y presione la opcin 4 o envenos un mensaje a travs de MyChart.   No podemos decirle cul ser su copago por los medicamentos por adelantado ya que esto es diferente dependiendo de la cobertura de su seguro. Sin embargo, es posible que podamos encontrar un medicamento sustituto a menor costo o llenar un formulario para que el seguro cubra el medicamento que se considera necesario.   Si se requiere una autorizacin previa para que su compaa de seguros cubra su medicamento, por favor permtanos de 1 a 2  das hbiles para completar este proceso.  Los precios de los medicamentos varan con frecuencia dependiendo del lugar de dnde se surte la receta y alguna farmacias pueden ofrecer precios ms baratos.  El sitio web www.goodrx.com tiene cupones para medicamentos de diferentes farmacias. Los precios aqu no tienen en cuenta lo que podra costar con la ayuda del seguro (puede ser ms barato con su seguro), pero el sitio web puede darle el precio si no utiliz ningn seguro.  - Puede imprimir el cupn correspondiente y llevarlo con su receta a la farmacia.  - Tambin puede pasar por nuestra oficina durante el horario de atencin regular y recoger una tarjeta de cupones de GoodRx.  - Si necesita que su receta se enve electrnicamente a una farmacia diferente, informe a nuestra oficina a travs de MyChart de [place] o por telfono llamando al [phone] y presione la opcin 4.  

## 2022-01-30 NOTE — Progress Notes (Signed)
Follow-Up Visit   Subjective  Catherine Fields is a 37 y.o. female who presents for the following: Annual Exam (1 year tbse, recheck  right mid cheek reports treated with Ln2 in past. ).  Still there and is somewhat bothersome.  The patient presents for Total-Body Skin Exam (TBSE) for skin cancer screening and mole check.  The patient has spots, moles and lesions to be evaluated, some may be new or changing and the patient has concerns that these could be cancer.     The following portions of the chart were reviewed this encounter and updated as appropriate:      Review of Systems: No other skin or systemic complaints except as noted in HPI or Assessment and Plan.   Objective  Well appearing patient in no apparent distress; mood and affect are within normal limits.  A full examination was performed including scalp, head, eyes, ears, nose, lips, neck, chest, axillae, abdomen, back, buttocks, bilateral upper extremities, bilateral lower extremities, hands, feet, fingers, toes, fingernails, and toenails. All findings within normal limits unless otherwise noted below.  right upper abdomen 2 mm medium dark brown macule with notch  Left Flank 2 mm medium dark brown macule   Left Antihelix 2.5 mm gray brown macule   right mid cheek 0.9 x 0.7 mm firm flesh/hypopigmented flat papule with central light tan pigment   Assessment & Plan  Nevus (4) right mid cheek; right upper abdomen; Left Flank; Left Antihelix  Benign-appearing. Stable compared to previous visit. Observation.  Call clinic for new or changing moles.  Recommend daily use of broad spectrum spf 30+ sunscreen to sun-exposed areas.    Lentigines - Scattered tan macules - Due to sun exposure - Benign-appearing, observe - Recommend daily broad spectrum sunscreen SPF 30+ to sun-exposed areas, reapply every 2 hours as needed. - Call for any changes  Seborrheic Keratoses - Stuck-on, waxy, tan-brown papules and/or plaques   - Benign-appearing - Discussed benign etiology and prognosis. - Observe - Call for any changes  Melanocytic Nevi - Tan-brown and/or pink-flesh-colored symmetric macules and papules - Benign appearing on exam today - Observation - Call clinic for new or changing moles - Recommend daily use of broad spectrum spf 30+ sunscreen to sun-exposed areas.   Hemangiomas - Red papules - Discussed benign nature - Observe - Call for any changes  Dermatofibroma- bx proven left upper pretibia Vs cyst at left neck 8 x 4 mm pink brown firm nodule  - Firm pink/brown papulenodule with dimple sign  - Benign appearing.  A dermatofibroma is a benign growth possibly related to trauma, such as an insect bite, cut from shaving, or inflamed acne-type bump.  Treatment options to remove include shave or excision with resulting scar and risk of recurrence.  Since not bothersome, will observe for now.  - Call for any changes  Actinic Damage - Chronic condition, secondary to cumulative UV/sun exposure - diffuse scaly erythematous macules with underlying dyspigmentation - Recommend daily broad spectrum sunscreen SPF 30+ to sun-exposed areas, reapply every 2 hours as needed.  - Staying in the shade or wearing long sleeves, sun glasses (UVA+UVB protection) and wide brim hats (4-inch brim around the entire circumference of the hat) are also recommended for sun protection.  - Call for new or changing lesions.  Skin cancer screening performed today. Return in about 1 year (around 01/31/2023) for TBSE.  I, Ruthell Rummage, CMA, am acting as scribe for Brendolyn Patty, MD.  Documentation: I have reviewed the above  documentation for accuracy and completeness, and I agree with the above.  Brendolyn Patty MD

## 2022-02-01 NOTE — Telephone Encounter (Signed)
I do not mind sending in metformin XR 500 mg q day. Let me know if any problems.

## 2022-02-02 ENCOUNTER — Other Ambulatory Visit: Payer: Self-pay

## 2022-02-02 MED ORDER — METFORMIN HCL ER 500 MG PO TB24: 500.0000 mg | ORAL_TABLET | Freq: Every day | ORAL | 3 refills | Status: DC

## 2022-02-03 DIAGNOSIS — G4733 Obstructive sleep apnea (adult) (pediatric): Secondary | ICD-10-CM | POA: Diagnosis not present

## 2022-02-09 ENCOUNTER — Ambulatory Visit: Payer: Self-pay

## 2022-02-14 ENCOUNTER — Ambulatory Visit (INDEPENDENT_AMBULATORY_CARE_PROVIDER_SITE_OTHER): Payer: Self-pay

## 2022-02-14 DIAGNOSIS — Z23 Encounter for immunization: Secondary | ICD-10-CM

## 2022-03-01 ENCOUNTER — Encounter: Payer: Self-pay | Admitting: Adult Health

## 2022-03-01 ENCOUNTER — Ambulatory Visit (INDEPENDENT_AMBULATORY_CARE_PROVIDER_SITE_OTHER): Payer: Self-pay | Admitting: Adult Health

## 2022-03-01 VITALS — HR 86 | Temp 98.3°F

## 2022-03-01 DIAGNOSIS — H938X1 Other specified disorders of right ear: Secondary | ICD-10-CM

## 2022-03-01 NOTE — Progress Notes (Signed)
[facility] [address], [place] [address]   Office Visit Note  Patient Name: Catherine Fields Date of Birth [id number]  Medical Record number [id number]  Date of Service: 03/01/2022  Chief Complaint  Patient presents with   Ear Fullness    Right ear fullness and gets "swimmy headed"      Ear Fullness  Pertinent negatives include no coughing, diarrhea or vomiting.   Pt is here for a sick visit. She reports every year around spring she has issues.  Yesterday she noticed she became swimmy headed when looking to the side.  She layed down at work an sitting up make her get that feeling again.  She tried Popping her ears, and she can feel fullness in her right ear.   She has a history of vertigo, but says this is not as bad as vertigo.  She usually does Flonase, Claritin D, afrin at times.   Current Medication:  Outpatient Encounter Medications as of 03/01/2022  Medication Sig   fluticasone (FLONASE ALLERGY RELIEF) 50 MCG/ACT nasal spray as needed.   metFORMIN (GLUCOPHAGE-XR) 500 MG 24 hr tablet Take 1 tablet (500 mg total) by mouth daily with breakfast.   pantoprazole (PROTONIX) 40 MG tablet Take 1 tablet (40 mg total) by mouth daily.   Semaglutide-Weight Management (WEGOVY) 0.25 MG/0.5ML SOAJ Inject 0.25 mg into the skin once a week.   No facility-administered encounter medications on file as of 03/01/2022.      Medical History: Past Medical History:  Diagnosis Date   Asthma    Chicken pox    GERD (gastroesophageal reflux disease)    Hyperkalemia    Kidney stones    Sleep apnea      Vital Signs: Pulse 86   Temp 98.3 F (36.8 C) (Tympanic)   SpO2 98%    Review of Systems  Constitutional:  Negative for chills, fatigue and fever.  HENT:  Positive for ear pain and sinus pressure.   Eyes:  Negative for pain and itching.  Respiratory:  Negative for cough.   Cardiovascular:  Negative for chest pain.  Gastrointestinal:  Negative for diarrhea and  vomiting.    Physical Exam Vitals and nursing note reviewed.  Constitutional:      Appearance: Normal appearance.  HENT:     Head: Normocephalic.     Right Ear: Ear canal normal.     Left Ear: Tympanic membrane and ear canal normal.     Ears:     Comments: Right TM has clear fluid behind.  Neurological:     Mental Status: She is alert.    Assessment/Plan: 1. Congestion of right ear Discussed using Daily allergy medication such as Zyrtec or Claritin. Also instructed patient to use Flonase, Two sprays in each nostril twice daily.  May take sudafed as needed.      General Counseling: [name] verbalizes understanding of the findings of todays visit and agrees with plan of treatment. I have discussed any further diagnostic evaluation that may be needed or ordered today. We also reviewed her medications today. she has been encouraged to call the office with any questions or concerns that should arise related to todays visit.   No orders of the defined types were placed in this encounter.   No orders of the defined types were placed in this encounter.   Time spent:20 Minutes    Kendell Bane AGNP-C Nurse Practitioner

## 2022-03-04 DIAGNOSIS — G4733 Obstructive sleep apnea (adult) (pediatric): Secondary | ICD-10-CM | POA: Diagnosis not present

## 2022-04-04 DIAGNOSIS — G4733 Obstructive sleep apnea (adult) (pediatric): Secondary | ICD-10-CM | POA: Diagnosis not present

## 2022-05-04 DIAGNOSIS — G4733 Obstructive sleep apnea (adult) (pediatric): Secondary | ICD-10-CM | POA: Diagnosis not present

## 2022-06-17 DIAGNOSIS — G4733 Obstructive sleep apnea (adult) (pediatric): Secondary | ICD-10-CM | POA: Diagnosis not present

## 2022-07-16 ENCOUNTER — Encounter: Payer: Self-pay | Admitting: Internal Medicine

## 2022-07-16 NOTE — Telephone Encounter (Signed)
 LMTCB

## 2022-07-16 NOTE — Telephone Encounter (Signed)
Patient scheduled to discuss virtually with Dr Lorin Picket this week.

## 2022-07-19 ENCOUNTER — Telehealth: Payer: BC Managed Care – PPO | Admitting: Internal Medicine

## 2022-07-19 ENCOUNTER — Encounter: Payer: Self-pay | Admitting: Internal Medicine

## 2022-07-19 ENCOUNTER — Other Ambulatory Visit: Payer: Self-pay

## 2022-07-19 VITALS — Ht 66.0 in | Wt 310.0 lb

## 2022-07-19 DIAGNOSIS — N926 Irregular menstruation, unspecified: Secondary | ICD-10-CM | POA: Diagnosis not present

## 2022-07-19 DIAGNOSIS — G4730 Sleep apnea, unspecified: Secondary | ICD-10-CM

## 2022-07-19 NOTE — Progress Notes (Unsigned)
Patient ID: Catherine Fields, female   DOB: [birth year], 37 y.o.   MRN: [id number]   Virtual Visit via video Note  I connected with Catherine Fields by a video enabled telemedicine application and verified that I am speaking with the correct person using two identifiers. Location patient: home Location provider: work  Persons participating in the virtual visit: patient, provider  The limitations, risks, security and privacy concerns of performing an evaluation and management service by video and the availability of in person appointments have been discussed.  It has also been discussed with the patient that there may be a patient responsible charge related to this service. The patient expressed understanding and agreed to proceed.   Reason for visit: work in appt  HPI: Work in to discuss concern regarding a "late period". Reports that her last period started 06/09/22.  Was due to start around 07/07/22.  Has not started and had questions.  Has taken several home pregnancy tests that have been negative. Two tests this am negative.  She does report that around the time she was supposed to start, she did have some light cramping and breast tenderness.  Never had bleeding or spotting.  Had upset stomach, which she normally has as well.  All of the pre menstrual symptoms have resolved.  No bleeding.  No new medication.  Does report increased stress at work and she lost her dog.  Overall she feels she is coping relatively well.    ROS: See pertinent positives and negatives per HPI.  Past Medical History:  Diagnosis Date   Asthma    Chicken pox    GERD (gastroesophageal reflux disease)    Hyperkalemia    Kidney stones    Sleep apnea     Past Surgical History:  Procedure Laterality Date   ESOPHAGOGASTRODUODENOSCOPY (EGD) WITH PROPOFOL N/A 02/07/2015   Procedure: ESOPHAGOGASTRODUODENOSCOPY (EGD) WITH PROPOFOL;  Surgeon: Elnita Maxwell, MD;  Location: [facility] ENDOSCOPY;  Service: Endoscopy;  Laterality:  N/A;    Family History  Problem Relation Age of Onset   Asthma Mother    Depression Mother    Obesity Mother    Arthritis Mother    Heart attack Mother    Heart disease Mother    Hyperlipidemia Father    Depression Father    Diabetes Father    Hearing loss Father    Hypertension Father    Mental illness Father    Cancer Maternal Grandmother    Bone cancer Maternal Grandfather     SOCIAL HX: reviewed.    Current Outpatient Medications:    fluticasone (FLONASE ALLERGY RELIEF) 50 MCG/ACT nasal spray, as needed., Disp: , Rfl:    pantoprazole (PROTONIX) 40 MG tablet, Take 1 tablet (40 mg total) by mouth daily., Disp: 90 tablet, Rfl: 1   Semaglutide-Weight Management (WEGOVY) 0.25 MG/0.5ML SOAJ, Inject 0.25 mg into the skin once a week., Disp: 2 mL, Rfl: 1  EXAM:  GENERAL: alert, oriented, appears well and in no acute distress  HEENT: atraumatic, conjunttiva clear, no obvious abnormalities on inspection of external nose and ears  NECK: normal movements of the head and neck  LUNGS: on inspection no signs of respiratory distress, breathing rate appears normal, no obvious gross SOB, gasping or wheezing  CV: no obvious cyanosis  PSYCH/NEURO: pleasant and cooperative, no obvious depression or anxiety, speech and thought processing grossly intact  ASSESSMENT AND PLAN:  Discussed the following assessment and plan:  Problem List Items Addressed This Visit   None  No follow-ups on file.   I discussed the assessment and treatment plan with the patient. The patient was provided an opportunity to ask questions and all were answered. The patient agreed with the plan and demonstrated an understanding of the instructions.   The patient was advised to call back or seek an in-person evaluation if the symptoms worsen or if the condition fails to improve as anticipated.    Dale Lynn, MD

## 2022-07-20 ENCOUNTER — Telehealth: Payer: Self-pay

## 2022-07-20 NOTE — Telephone Encounter (Signed)
*  Primary  PA request received for [facility] 0.25MG /0.5ML auto-injectors  PA submitted to [employer] via CMM and is pending additional questions/determination   Key: [id number]

## 2022-07-22 ENCOUNTER — Encounter: Payer: Self-pay | Admitting: Internal Medicine

## 2022-07-22 DIAGNOSIS — N926 Irregular menstruation, unspecified: Secondary | ICD-10-CM | POA: Insufficient documentation

## 2022-07-22 NOTE — Assessment & Plan Note (Signed)
Reports did not start period - this month. Had premenstrual symptoms as outlined.  Premenstrual symptoms have subsided.  Multiple urine pregnancy tests have been negative. Discussed for her to keep menstrual diary.  No other symptoms.  No other new medication,e tc.  Follow. Call with update.

## 2022-07-22 NOTE — Assessment & Plan Note (Signed)
Saw Dr Halford Chessman - auto cpap - 5-20.

## 2022-07-24 NOTE — Telephone Encounter (Signed)
Notified patient via mychart

## 2022-07-24 NOTE — Telephone Encounter (Signed)
Pharmacy Patient Advocate Encounter  Received notification from [facility] that Prior Authorization for Reginal Lutes has been APPROVED from 07/20/22 to 11/23/22.Marland Kitchen  PA #/Case ID/Reference #: [id number]  Approval letter attached to charts

## 2022-07-25 ENCOUNTER — Other Ambulatory Visit: Payer: Self-pay

## 2022-07-26 ENCOUNTER — Other Ambulatory Visit: Payer: Self-pay

## 2022-08-08 ENCOUNTER — Other Ambulatory Visit: Payer: Self-pay

## 2022-08-08 MED ORDER — SEMAGLUTIDE-WEIGHT MANAGEMENT 0.5 MG/0.5ML ~~LOC~~ SOAJ
0.5000 mg | SUBCUTANEOUS | 0 refills | Status: DC
  Filled 2022-08-08: qty 2, 28d supply, fill #0

## 2022-08-08 NOTE — Telephone Encounter (Signed)
Ok to increase to .5mg  wegovy

## 2022-08-08 NOTE — Telephone Encounter (Signed)
Ok to increase her wegovy dose for next prescription?

## 2022-08-09 ENCOUNTER — Encounter: Payer: Self-pay | Admitting: Internal Medicine

## 2022-08-09 NOTE — Telephone Encounter (Signed)
With these symptoms, I would recommend stopping the medication.  Sounds like may be having an allergic reaction to the medication.  If any progression of symptoms, needs to be evaluated.

## 2022-08-09 NOTE — Telephone Encounter (Signed)
Patient stated that this did not happen with the first shot but last week she had tingling sensation on her tongue and lips feel sunburn. No other symptoms. No trouble breathing. No swelling around mouth, no rash, etc. Symptoms are gone within 24 hours. The same symptoms occurred today after taking her 3rd shot last night. Symptoms are better right now but not completely resolved. Only allergy she is aware of is sulfa. Patient says she feels fine. No change in activity. Physically nothing has changed. She is wondering if this is something that is common with wegovy since she is not on any other medications. Pt says she has done well on this medication and really does not want to have to stop it if she doesn't have to.

## 2022-08-13 NOTE — Telephone Encounter (Signed)
Notify - remain off medication for now until can see if symptoms completely resolve.  Recommend referral to [facility].

## 2022-08-14 NOTE — Telephone Encounter (Signed)
Ok to schedule physical.  Also, continue to keep menstrual diary.

## 2022-10-03 ENCOUNTER — Encounter: Payer: Self-pay | Admitting: Adult Health

## 2022-10-03 ENCOUNTER — Other Ambulatory Visit: Payer: Self-pay

## 2022-10-03 ENCOUNTER — Ambulatory Visit (INDEPENDENT_AMBULATORY_CARE_PROVIDER_SITE_OTHER): Payer: Self-pay | Admitting: Adult Health

## 2022-10-03 VITALS — BP 130/80 | HR 91 | Temp 98.2°F | Ht 66.0 in | Wt 305.0 lb

## 2022-10-03 DIAGNOSIS — J22 Unspecified acute lower respiratory infection: Secondary | ICD-10-CM

## 2022-10-03 MED ORDER — PREDNISONE 10 MG PO TABS: ORAL_TABLET | ORAL | 0 refills | Status: AC

## 2022-10-03 MED ORDER — VENTOLIN HFA 108 (90 BASE) MCG/ACT IN AERS: INHALATION_SPRAY | RESPIRATORY_TRACT | 0 refills | Status: DC

## 2022-10-03 MED ORDER — AZITHROMYCIN 250 MG PO TABS: ORAL_TABLET | ORAL | 0 refills | Status: AC

## 2022-10-03 NOTE — Progress Notes (Signed)
[facility] [address] [place], [place] [address]   Office Visit Note  Patient Name: Catherine Fields Date of Birth [birth year]  Medical Record number [id number]  Date of Service: 10/03/2022  Chief Complaint  Patient presents with   Acute Visit    Patient c/o a persistent dry cough for the last 7-10 days. Cough has been persistent since having a viral illness about 2 weeks ago. Denies fever and SOB at this time. Her husband was also not feeling well last weekend. Patient took 5 COVID tests over the course of the last week at home which were all negative.      HPI Pt is here for a sick visit. She reports cough that started last week, and will not go away.  She was sick with fever, congestion and runny nose.  Those have resolved, but the cough has lingered.  Initially it was a wet cough, but now its dry.  She describes a deep, spasm cough that happens all the time, especially with laughing.    Current Medication:  Outpatient Encounter Medications as of 10/03/2022  Medication Sig   albuterol (VENTOLIN HFA) 108 (90 Base) MCG/ACT inhaler 2 puffs three times a day for 7-10 days. Then use 2 puffs every 4-6 hours as needed for cough.   azithromycin (ZITHROMAX) 250 MG tablet Take 2 tablets on day 1, then 1 tablet daily on days 2 through 5   fluticasone (FLONASE ALLERGY RELIEF) 50 MCG/ACT nasal spray as needed.   pantoprazole (PROTONIX) 40 MG tablet Take 1 tablet (40 mg total) by mouth daily. (Patient taking differently: Take 40 mg by mouth daily as needed.)   predniSONE (DELTASONE) 10 MG tablet Take 6 tablets (60 mg total) by mouth daily with breakfast for 1 day, THEN 5 tablets (50 mg total) daily with breakfast for 1 day, THEN 4 tablets (40 mg total) daily with breakfast for 1 day, THEN 3 tablets (30 mg total) daily with breakfast for 1 day, THEN 2 tablets (20 mg total) daily with breakfast for 1 day, THEN 1 tablet (10 mg total) daily with breakfast for 1 day.   [DISCONTINUED]  Semaglutide-Weight Management 0.5 MG/0.5ML SOAJ Inject 0.5 mg into the skin once a week for 28 days.   No facility-administered encounter medications on file as of 10/03/2022.      Medical History: Past Medical History:  Diagnosis Date   Asthma    Chicken pox    GERD (gastroesophageal reflux disease)    Hyperkalemia    Kidney stones    Sleep apnea      Vital Signs: BP 130/80   Pulse 91   Temp 98.2 F (36.8 C)   Ht 5\' 6"  (1.676 m)   Wt (!) 305 lb (138.3 kg)   SpO2 98%   BMI 49.23 kg/m    Review of Systems  Constitutional:  Negative for chills, fatigue and fever.  HENT:  Negative for congestion, rhinorrhea, sinus pressure and sore throat.   Respiratory:  Positive for cough, chest tightness and wheezing.   Cardiovascular:  Negative for chest pain.  Gastrointestinal:  Negative for diarrhea and vomiting.    Physical Exam Vitals and nursing note reviewed.  Constitutional:      Appearance: Normal appearance.  HENT:     Head: Normocephalic.     Right Ear: Tympanic membrane and ear canal normal.     Left Ear: Tympanic membrane and ear canal normal.     Nose: Nose normal.  Eyes:  Pupils: Pupils are equal, round, and reactive to light.  Pulmonary:     Effort: Pulmonary effort is normal.     Breath sounds: Wheezing and rhonchi present.  Lymphadenopathy:     Cervical: No cervical adenopathy.  Neurological:     Mental Status: She is alert.    Assessment/Plan: 1. Lower resp. tract infection Take Prednisone as prescribed.  Use inhaler as discussed.  Follow up via MyChart messenger if symptoms fail to improve or may return to clinic as needed for worsening symptoms.   - predniSONE (DELTASONE) 10 MG tablet; Take 6 tablets (60 mg total) by mouth daily with breakfast for 1 day, THEN 5 tablets (50 mg total) daily with breakfast for 1 day, THEN 4 tablets (40 mg total) daily with breakfast for 1 day, THEN 3 tablets (30 mg total) daily with breakfast for 1 day, THEN 2 tablets  (20 mg total) daily with breakfast for 1 day, THEN 1 tablet (10 mg total) daily with breakfast for 1 day.  Dispense: 21 tablet; Refill: 0 - azithromycin (ZITHROMAX) 250 MG tablet; Take 2 tablets on day 1, then 1 tablet daily on days 2 through 5  Dispense: 6 tablet; Refill: 0     General Counseling: Catherine Fields verbalizes understanding of the findings of todays visit and agrees with plan of treatment. I have discussed any further diagnostic evaluation that may be needed or ordered today. We also reviewed her medications today. she has been encouraged to call the office with any questions or concerns that should arise related to todays visit.   No orders of the defined types were placed in this encounter.   Meds ordered this encounter  Medications   albuterol (VENTOLIN HFA) 108 (90 Base) MCG/ACT inhaler    Sig: 2 puffs three times a day for 7-10 days. Then use 2 puffs every 4-6 hours as needed for cough.    Dispense:  18 each    Refill:  0   predniSONE (DELTASONE) 10 MG tablet    Sig: Take 6 tablets (60 mg total) by mouth daily with breakfast for 1 day, THEN 5 tablets (50 mg total) daily with breakfast for 1 day, THEN 4 tablets (40 mg total) daily with breakfast for 1 day, THEN 3 tablets (30 mg total) daily with breakfast for 1 day, THEN 2 tablets (20 mg total) daily with breakfast for 1 day, THEN 1 tablet (10 mg total) daily with breakfast for 1 day.    Dispense:  21 tablet    Refill:  0   azithromycin (ZITHROMAX) 250 MG tablet    Sig: Take 2 tablets on day 1, then 1 tablet daily on days 2 through 5    Dispense:  6 tablet    Refill:  0    Time spent:15 Minutes    Johnna Acosta AGNP-C Nurse Practitioner

## 2022-10-09 ENCOUNTER — Telehealth: Payer: Self-pay

## 2022-10-09 DIAGNOSIS — G4733 Obstructive sleep apnea (adult) (pediatric): Secondary | ICD-10-CM | POA: Diagnosis not present

## 2022-10-09 NOTE — Telephone Encounter (Signed)
Noted  

## 2022-10-09 NOTE — Telephone Encounter (Signed)
Patient states she missed a period in July.  Patient states since then she has been having two plus week long periods.  I have scheduled an appointment for patient to see Dr. Dale Sumner on 10/16/2022.

## 2022-10-16 ENCOUNTER — Ambulatory Visit: Payer: BC Managed Care – PPO | Admitting: Internal Medicine

## 2022-10-16 VITALS — BP 130/72 | HR 83 | Temp 98.1°F | Resp 16 | Ht 66.0 in | Wt 305.0 lb

## 2022-10-16 DIAGNOSIS — N921 Excessive and frequent menstruation with irregular cycle: Secondary | ICD-10-CM

## 2022-10-16 DIAGNOSIS — R051 Acute cough: Secondary | ICD-10-CM

## 2022-10-16 DIAGNOSIS — N920 Excessive and frequent menstruation with regular cycle: Secondary | ICD-10-CM | POA: Insufficient documentation

## 2022-10-16 NOTE — Progress Notes (Signed)
Subjective:    Patient ID: Catherine Fields, female    DOB: 23-May-1985, 37 y.o.   MRN: [id number]  Patient here for  Chief Complaint  Patient presents with   Menstrual Problem    HPI Work in appt to discuss her menstrual period. She missed her menstrual period in July.  In 08/2022 - spotting and then regular flow, clots - 15-16 days. Brief break from bleeding then then bleeding restarted 09/23/22 - and continued with bleeding.  10/15/22 bleeding stopped.  No change in diet and exercise.  No new medication.  Evaluated 10/03/22 - urgent care - cough - persistent. Treated with zpak and prednisone. Doing better.  Feels better.    Past Medical History:  Diagnosis Date   Asthma    Chicken pox    GERD (gastroesophageal reflux disease)    Hyperkalemia    Kidney stones    Sleep apnea    Past Surgical History:  Procedure Laterality Date   ESOPHAGOGASTRODUODENOSCOPY (EGD) WITH PROPOFOL N/A 02/07/2015   Procedure: ESOPHAGOGASTRODUODENOSCOPY (EGD) WITH PROPOFOL;  Surgeon: Elnita Maxwell, MD;  Location: [facility] ENDOSCOPY;  Service: Endoscopy;  Laterality: N/A;   Family History  Problem Relation Age of Onset   Asthma Mother    Depression Mother    Obesity Mother    Arthritis Mother    Heart attack Mother    Heart disease Mother    Hyperlipidemia Father    Depression Father    Diabetes Father    Hearing loss Father    Hypertension Father    Mental illness Father    Cancer Maternal Grandmother    Bone cancer Maternal Grandfather    Social History   Socioeconomic History   Marital status: Married    Spouse name: Not on file   Number of children: Not on file   Years of education: Not on file   Highest education level: Bachelor's degree (e.g., BA, AB, BS)  Occupational History   Not on file  Tobacco Use   Smoking status: Never   Smokeless tobacco: Never  Vaping Use   Vaping status: Not on file  Substance and Sexual Activity   Alcohol use: Yes    Comment: RARELY   Drug use:  Never   Sexual activity: Yes  Other Topics Concern   Not on file  Social History Narrative   Not on file   Social Determinants of Health   Financial Resource Strain: Low Risk  (10/16/2022)   Overall Financial Resource Strain (CARDIA)    Difficulty of Paying Living Expenses: Not hard at all  Food Insecurity: No Food Insecurity (10/16/2022)   Hunger Vital Sign    Worried About Running Out of Food in the Last Year: Never true    Ran Out of Food in the Last Year: Never true  Transportation Needs: No Transportation Needs (10/16/2022)   PRAPARE - Administrator, Civil Service (Medical): No    Lack of Transportation (Non-Medical): No  Physical Activity: Insufficiently Active (10/16/2022)   Exercise Vital Sign    Days of Exercise per Week: 2 days    Minutes of Exercise per Session: 10 min  Stress: Stress Concern Present (10/16/2022)   [employer] of Occupational Health - Occupational Stress Questionnaire    Feeling of Stress : To some extent  Social Connections: Moderately Integrated (10/16/2022)   Social Connection and Isolation Panel [NHANES]    Frequency of Communication with Friends and Family: Twice a week    Frequency of Social Gatherings  with Friends and Family: Once a week    Attends Religious Services: 1 to 4 times per year    Active Member of [employer] or Organizations: No    Attends Engineer, structural: Not on file    Marital Status: Married     Review of Systems  Constitutional:  Negative for appetite change and unexpected weight change.  HENT:  Negative for congestion and sinus pressure.   Respiratory:  Negative for cough, chest tightness and shortness of breath.   Cardiovascular:  Negative for chest pain and palpitations.  Gastrointestinal:  Negative for abdominal pain, diarrhea, nausea and vomiting.  Genitourinary:  Negative for difficulty urinating and dysuria.       Menorrhagia  Musculoskeletal:  Negative for joint swelling and myalgias.   Skin:  Negative for color change and rash.  Neurological:  Negative for dizziness and headaches.  Psychiatric/Behavioral:  Negative for agitation and dysphoric mood.        Objective:     BP 130/72   Pulse 83   Temp 98.1 F (36.7 C)   Resp 16   Ht 5\' 6"  (1.676 m)   Wt (!) 305 lb (138.3 kg)   SpO2 98%   BMI 49.23 kg/m  Wt Readings from Last 3 Encounters:  10/16/22 (!) 305 lb (138.3 kg)  10/03/22 (!) 305 lb (138.3 kg)  07/19/22 (!) 310 lb (140.6 kg)    Physical Exam Vitals reviewed.  Constitutional:      General: She is not in acute distress.    Appearance: Normal appearance.  HENT:     Head: Normocephalic and atraumatic.     Right Ear: External ear normal.     Left Ear: External ear normal.  Eyes:     General: No scleral icterus.       Right eye: No discharge.        Left eye: No discharge.     Conjunctiva/sclera: Conjunctivae normal.  Neck:     Thyroid: No thyromegaly.  Cardiovascular:     Rate and Rhythm: Normal rate and regular rhythm.  Pulmonary:     Effort: No respiratory distress.     Breath sounds: Normal breath sounds. No wheezing.  Abdominal:     General: Bowel sounds are normal.     Palpations: Abdomen is soft.     Tenderness: There is no abdominal tenderness.  Musculoskeletal:        General: No swelling or tenderness.     Cervical back: Neck supple. No tenderness.  Lymphadenopathy:     Cervical: No cervical adenopathy.  Skin:    Findings: No erythema or rash.  Neurological:     Mental Status: She is alert.  Psychiatric:        Mood and Affect: Mood normal.        Behavior: Behavior normal.      Outpatient Encounter Medications as of 10/16/2022  Medication Sig   fluticasone (FLONASE ALLERGY RELIEF) 50 MCG/ACT nasal spray as needed.   pantoprazole (PROTONIX) 40 MG tablet Take 1 tablet (40 mg total) by mouth daily. (Patient taking differently: Take 40 mg by mouth daily as needed.)   [DISCONTINUED] albuterol (VENTOLIN HFA) 108 (90 Base)  MCG/ACT inhaler 2 puffs three times a day for 7-10 days. Then use 2 puffs every 4-6 hours as needed for cough.   No facility-administered encounter medications on file as of 10/16/2022.     Lab Results  Component Value Date   WBC 9.7 08/08/2021   HGB 14.0  08/08/2021   HCT 43.2 08/08/2021   PLT 388.0 08/08/2021   GLUCOSE 89 05/08/2021   CHOL 184 05/08/2021   TRIG 105.0 05/08/2021   HDL 50.70 05/08/2021   LDLCALC 113 (H) 05/08/2021   ALT 19 05/08/2021   AST 15 05/08/2021   NA 138 05/08/2021   K 4.0 05/08/2021   CL 103 05/08/2021   CREATININE 0.81 05/08/2021   BUN 16 05/08/2021   CO2 26 05/08/2021   TSH 4.94 05/08/2021       Assessment & Plan:  Menorrhagia with irregular cycle Assessment & Plan: Increased bleeding as outlined.  No change with exercise or diet.  Discussed further w/up and evaluation, including possible pelvic ultrasound, etc.  Refer to gyn for further evaluation.   Orders: -     Ambulatory referral to Gynecology  Acute cough Assessment & Plan: Recent cough and congestion.  Doing better.  Follow.       Dale [place], MD

## 2022-10-21 ENCOUNTER — Encounter: Payer: Self-pay | Admitting: Internal Medicine

## 2022-10-21 NOTE — Assessment & Plan Note (Signed)
Recent cough and congestion.  Doing better.  Follow.

## 2022-10-21 NOTE — Assessment & Plan Note (Signed)
Increased bleeding as outlined.  No change with exercise or diet.  Discussed further w/up and evaluation, including possible pelvic ultrasound, etc.  Refer to gyn for further evaluation.

## 2022-10-25 DIAGNOSIS — N920 Excessive and frequent menstruation with regular cycle: Secondary | ICD-10-CM | POA: Diagnosis not present

## 2022-11-20 DIAGNOSIS — D251 Intramural leiomyoma of uterus: Secondary | ICD-10-CM | POA: Diagnosis not present

## 2022-11-20 DIAGNOSIS — N920 Excessive and frequent menstruation with regular cycle: Secondary | ICD-10-CM | POA: Diagnosis not present

## 2022-12-06 ENCOUNTER — Other Ambulatory Visit: Payer: Self-pay | Admitting: Obstetrics and Gynecology

## 2022-12-13 DIAGNOSIS — D251 Intramural leiomyoma of uterus: Secondary | ICD-10-CM | POA: Diagnosis not present

## 2022-12-13 DIAGNOSIS — N920 Excessive and frequent menstruation with regular cycle: Secondary | ICD-10-CM | POA: Diagnosis not present

## 2022-12-19 ENCOUNTER — Encounter
Admission: RE | Admit: 2022-12-19 | Discharge: 2022-12-19 | Disposition: A | Discharge status: Home / Self Care | Payer: BC Managed Care – PPO | Source: Ambulatory Visit | Attending: Obstetrics and Gynecology | Admitting: Obstetrics and Gynecology

## 2022-12-19 VITALS — Ht 66.0 in | Wt 304.9 lb

## 2022-12-19 DIAGNOSIS — Z01812 Encounter for preprocedural laboratory examination: Secondary | ICD-10-CM

## 2022-12-19 DIAGNOSIS — N939 Abnormal uterine and vaginal bleeding, unspecified: Secondary | ICD-10-CM

## 2022-12-19 HISTORY — DX: Elevated white blood cell count, unspecified: D72.829

## 2022-12-19 HISTORY — DX: Obstructive sleep apnea (adult) (pediatric): G47.33

## 2022-12-19 HISTORY — DX: Leiomyoma of uterus, unspecified: D25.9

## 2022-12-19 HISTORY — DX: Abnormal uterine and vaginal bleeding, unspecified: N93.9

## 2022-12-19 HISTORY — DX: Morbid (severe) obesity due to excess calories: E66.01

## 2022-12-19 NOTE — Patient Instructions (Addendum)
Your procedure is scheduled on:12-28-22 Friday Report to the Registration Desk on the 1st floor of the [facility].Then proceed to the 2nd floor Surgery Desk To find out your arrival time, please call [phone] between 1PM - 3PM on:12-27-22 Thursday If your arrival time is 6:00 am, do not arrive before that time as the [facility] entrance doors do not open until 6:00 am.  REMEMBER: Instructions that are not followed completely may result in serious medical risk, up to and including death; or upon the discretion of your surgeon and anesthesiologist your surgery may need to be rescheduled.  Do not eat food after midnight the night before surgery.  No gum chewing or hard candies.  You may however, drink CLEAR liquids up to 2 hours before you are scheduled to arrive for your surgery. Do not drink anything within 2 hours of your scheduled arrival time.  Clear liquids include: - water  - apple juice without pulp - gatorade (not RED colors) - black coffee or tea (Do NOT add milk or creamers to the coffee or tea) Do NOT drink anything that is not on this list.  One week prior to surgery:Starting 12-21-22  Stop Anti-inflammatories (NSAIDS) such as Advil, Aleve, Ibuprofen, Motrin, Naproxen, Naprosyn and Aspirin based products such as Excedrin, Goody's Powder, BC Powder. Stop ANY OVER THE COUNTER supplements until after surgery.  You may however, continue to take Tylenol if needed for pain up until the day of surgery  Continue taking all of your other prescription medications up until the day of surgery.  Do NOT take any medication the day of surgery  Bring your Albuterol Inhaler to the hospital  No Alcohol for 24 hours before or after surgery.  No Smoking including e-cigarettes for 24 hours before surgery.  No chewable tobacco products for at least 6 hours before surgery.  No nicotine patches on the day of surgery.  Do not use any "recreational" drugs for at least a week  (preferably 2 weeks) before your surgery.  Please be advised that the combination of cocaine and anesthesia may have negative outcomes, up to and including death. If you test positive for cocaine, your surgery will be cancelled.  On the morning of surgery brush your teeth with toothpaste and water, you may rinse your mouth with mouthwash if you wish. Do not swallow any toothpaste or mouthwash.  Use CHG Soap as directed on instruction sheet.  Do not wear jewelry, make-up, hairpins, clips or nail polish.  For welded (permanent) jewelry: bracelets, anklets, waist bands, etc.  Please have this removed prior to surgery.  If it is not removed, there is a chance that hospital personnel will need to cut it off on the day of surgery.  Do not wear lotions, powders, or perfumes.   Do not shave body hair from the neck down 48 hours before surgery.  Contact lenses, hearing aids and dentures may not be worn into surgery.  Do not bring valuables to the hospital. [facility] is not responsible for any missing/lost belongings or valuables.   Bring your C-PAP to the hospital  Notify your doctor if there is any change in your medical condition (cold, fever, infection).  Wear comfortable clothing (specific to your surgery type) to the hospital.  After surgery, you can help prevent lung complications by doing breathing exercises.  Take deep breaths and cough every 1-2 hours. Your doctor may order a device called an Incentive Spirometer to help you take deep breaths. When coughing or  sneezing, hold a pillow firmly against your incision with both hands. This is called "splinting." Doing this helps protect your incision. It also decreases belly discomfort.  If you are being admitted to the hospital overnight, leave your suitcase in the car. After surgery it may be brought to your room.  In case of increased patient census, it may be necessary for you, the patient, to continue your postoperative care in the  Same Day Surgery department.  If you are being discharged the day of surgery, you will not be allowed to drive home. You will need a responsible individual to drive you home and stay with you for 24 hours after surgery.   If you are taking public transportation, you will need to have a responsible individual with you.  Please call the Pre-admissions Testing Dept. at [phone] if you have any questions about these instructions.  Surgery Visitation Policy:  Patients having surgery or a procedure may have two visitors.  Children under the age of 39 must have an adult with them who is not the patient.     Preparing for Surgery with CHLORHEXIDINE GLUCONATE (CHG) Soap  Chlorhexidine Gluconate (CHG) Soap  o An antiseptic cleaner that kills germs and bonds with the skin to continue killing germs even after washing  o Used for showering the night before surgery and morning of surgery  Before surgery, you can play an important role by reducing the number of germs on your skin.  CHG (Chlorhexidine gluconate) soap is an antiseptic cleanser which kills germs and bonds with the skin to continue killing germs even after washing.  Please do not use if you have an allergy to CHG or antibacterial soaps. If your skin becomes reddened/irritated stop using the CHG.  1. Shower the NIGHT BEFORE SURGERY and the MORNING OF SURGERY with CHG soap.  2. If you choose to wash your hair, wash your hair first as usual with your normal shampoo.  3. After shampooing, rinse your hair and body thoroughly to remove the shampoo.  4. Use CHG as you would any other liquid soap. You can apply CHG directly to the skin and wash gently with a scrungie or a clean washcloth.  5. Apply the CHG soap to your body only from the neck down. Do not use on open wounds or open sores. Avoid contact with your eyes, ears, mouth, and genitals (private parts). Wash face and genitals (private parts) with your normal soap.  6. Wash  thoroughly, paying special attention to the area where your surgery will be performed.  7. Thoroughly rinse your body with warm water.  8. Do not shower/wash with your normal soap after using and rinsing off the CHG soap.  9. Pat yourself dry with a clean towel.  10. Wear clean pajamas to bed the night before surgery.  12. Place clean sheets on your bed the night of your first shower and do not sleep with pets.  13. Shower again with the CHG soap on the day of surgery prior to arriving at the hospital.  14. Do not apply any deodorants/lotions/powders.  15. Please wear clean clothes to the hospital.

## 2022-12-24 ENCOUNTER — Encounter
Admission: RE | Admit: 2022-12-24 | Discharge: 2022-12-24 | Disposition: A | Discharge status: Home / Self Care | Payer: BC Managed Care – PPO | Source: Ambulatory Visit | Attending: Obstetrics and Gynecology | Admitting: Obstetrics and Gynecology

## 2022-12-24 DIAGNOSIS — N939 Abnormal uterine and vaginal bleeding, unspecified: Secondary | ICD-10-CM | POA: Diagnosis not present

## 2022-12-24 DIAGNOSIS — Z01812 Encounter for preprocedural laboratory examination: Secondary | ICD-10-CM | POA: Diagnosis not present

## 2022-12-24 DIAGNOSIS — E66813 Obesity, class 3: Secondary | ICD-10-CM | POA: Insufficient documentation

## 2022-12-24 DIAGNOSIS — Z6841 Body Mass Index (BMI) 40.0 and over, adult: Secondary | ICD-10-CM | POA: Diagnosis not present

## 2022-12-24 LAB — CBC
HCT: 43.1 % (ref 36.0–46.0)
Hemoglobin: 13.6 g/dL (ref 12.0–15.0)
MCH: 26.4 pg (ref 26.0–34.0)
MCHC: 31.6 g/dL (ref 30.0–36.0)
MCV: 83.5 fL (ref 80.0–100.0)
Platelets: 413 10*3/uL — ABNORMAL HIGH (ref 150–400)
RBC: 5.16 MIL/uL — ABNORMAL HIGH (ref 3.87–5.11)
RDW: 12.9 % (ref 11.5–15.5)
WBC: 11.3 10*3/uL — ABNORMAL HIGH (ref 4.0–10.5)
nRBC: 0 % (ref 0.0–0.2)

## 2022-12-24 LAB — BASIC METABOLIC PANEL
Anion gap: 10 (ref 5–15)
BUN: 15 mg/dL (ref 6–20)
CO2: 25 mmol/L (ref 22–32)
Calcium: 8.7 mg/dL — ABNORMAL LOW (ref 8.9–10.3)
Chloride: 102 mmol/L (ref 98–111)
Creatinine, Ser: 1.02 mg/dL — ABNORMAL HIGH (ref 0.44–1.00)
GFR, Estimated: >60 mL/min (ref >60)
Glucose, Bld: 114 mg/dL — ABNORMAL HIGH (ref 70–99)
Potassium: 4.3 mmol/L (ref 3.5–5.1)
Sodium: 137 mmol/L (ref 135–145)

## 2022-12-24 LAB — TYPE AND SCREEN
ABO/RH(D): A POS
Antibody Screen: NEGATIVE

## 2022-12-28 ENCOUNTER — Encounter: Payer: Self-pay | Admitting: Obstetrics and Gynecology

## 2022-12-28 ENCOUNTER — Encounter
Admission: RE | Disposition: A | Discharge status: Home / Self Care | Payer: Self-pay | Source: Ambulatory Visit | Attending: Obstetrics and Gynecology

## 2022-12-28 ENCOUNTER — Ambulatory Visit: Payer: BC Managed Care – PPO

## 2022-12-28 ENCOUNTER — Ambulatory Visit
Admission: RE | Admit: 2022-12-28 | Discharge: 2022-12-28 | Disposition: A | Discharge status: Home / Self Care | Payer: BC Managed Care – PPO | Source: Ambulatory Visit | Attending: Obstetrics and Gynecology | Admitting: Obstetrics and Gynecology

## 2022-12-28 ENCOUNTER — Other Ambulatory Visit: Payer: Self-pay

## 2022-12-28 ENCOUNTER — Ambulatory Visit: Payer: BC Managed Care – PPO | Admitting: Urgent Care

## 2022-12-28 DIAGNOSIS — D252 Subserosal leiomyoma of uterus: Secondary | ICD-10-CM | POA: Diagnosis not present

## 2022-12-28 DIAGNOSIS — D259 Leiomyoma of uterus, unspecified: Secondary | ICD-10-CM | POA: Diagnosis not present

## 2022-12-28 DIAGNOSIS — N939 Abnormal uterine and vaginal bleeding, unspecified: Secondary | ICD-10-CM | POA: Diagnosis not present

## 2022-12-28 DIAGNOSIS — Z6841 Body Mass Index (BMI) 40.0 and over, adult: Secondary | ICD-10-CM | POA: Diagnosis not present

## 2022-12-28 DIAGNOSIS — R102 Pelvic and perineal pain: Secondary | ICD-10-CM | POA: Diagnosis not present

## 2022-12-28 DIAGNOSIS — N838 Other noninflammatory disorders of ovary, fallopian tube and broad ligament: Secondary | ICD-10-CM | POA: Diagnosis not present

## 2022-12-28 DIAGNOSIS — J45909 Unspecified asthma, uncomplicated: Secondary | ICD-10-CM | POA: Diagnosis not present

## 2022-12-28 DIAGNOSIS — N72 Inflammatory disease of cervix uteri: Secondary | ICD-10-CM | POA: Diagnosis not present

## 2022-12-28 DIAGNOSIS — K219 Gastro-esophageal reflux disease without esophagitis: Secondary | ICD-10-CM | POA: Diagnosis not present

## 2022-12-28 DIAGNOSIS — N920 Excessive and frequent menstruation with regular cycle: Secondary | ICD-10-CM | POA: Insufficient documentation

## 2022-12-28 DIAGNOSIS — G4730 Sleep apnea, unspecified: Secondary | ICD-10-CM | POA: Diagnosis not present

## 2022-12-28 HISTORY — PX: CYSTOSCOPY: SHX5120

## 2022-12-28 HISTORY — PX: ROBOTIC ASSISTED LAPAROSCOPIC HYSTERECTOMY AND SALPINGECTOMY: SHX6379

## 2022-12-28 LAB — CBC
HCT: 45.6 % (ref 36.0–46.0)
Hemoglobin: 14.8 g/dL (ref 12.0–15.0)
MCH: 27.1 pg (ref 26.0–34.0)
MCHC: 32.5 g/dL (ref 30.0–36.0)
MCV: 83.5 fL (ref 80.0–100.0)
Platelets: 415 10*3/uL — ABNORMAL HIGH (ref 150–400)
RBC: 5.46 MIL/uL — ABNORMAL HIGH (ref 3.87–5.11)
RDW: 12.8 % (ref 11.5–15.5)
WBC: 12.4 10*3/uL — ABNORMAL HIGH (ref 4.0–10.5)
nRBC: 0 % (ref 0.0–0.2)

## 2022-12-28 LAB — TYPE AND SCREEN
ABO/RH(D): A POS
Antibody Screen: NEGATIVE

## 2022-12-28 LAB — BASIC METABOLIC PANEL
Anion gap: 10 (ref 5–15)
BUN: 12 mg/dL (ref 6–20)
CO2: 24 mmol/L (ref 22–32)
Calcium: 8.7 mg/dL — ABNORMAL LOW (ref 8.9–10.3)
Chloride: 102 mmol/L (ref 98–111)
Creatinine, Ser: 0.77 mg/dL (ref 0.44–1.00)
GFR, Estimated: >60 mL/min (ref >60)
Glucose, Bld: 122 mg/dL — ABNORMAL HIGH (ref 70–99)
Potassium: 4 mmol/L (ref 3.5–5.1)
Sodium: 136 mmol/L (ref 135–145)

## 2022-12-28 LAB — POCT PREGNANCY, URINE: Preg Test, Ur: NEGATIVE

## 2022-12-28 SURGERY — XI ROBOTIC ASSISTED LAPAROSCOPIC HYSTERECTOMY AND SALPINGECTOMY
Anesthesia: General

## 2022-12-28 MED ORDER — CHLORHEXIDINE GLUCONATE 0.12 % MT SOLN
15.0000 mL | Freq: Once | OROMUCOSAL | Status: AC
  Administered 2022-12-28: 15 mL via OROMUCOSAL

## 2022-12-28 MED ORDER — OXYCODONE HCL 5 MG PO TABS: 5.0000 mg | ORAL_TABLET | ORAL | 0 refills | Status: DC | PRN

## 2022-12-28 MED ORDER — ACETAMINOPHEN 500 MG PO TABS
ORAL_TABLET | ORAL | Status: AC
  Filled 2022-12-28: qty 2

## 2022-12-28 MED ORDER — OXYCODONE HCL 5 MG PO TABS
5.0000 mg | ORAL_TABLET | Freq: Once | ORAL | Status: AC | PRN
  Administered 2022-12-28: 5 mg via ORAL

## 2022-12-28 MED ORDER — CEFAZOLIN SODIUM-DEXTROSE 2-4 GM/100ML-% IV SOLN: 2.0000 g | INTRAVENOUS | Status: DC

## 2022-12-28 MED ORDER — FENTANYL CITRATE (PF) 100 MCG/2ML IJ SOLN
25.0000 ug | INTRAMUSCULAR | Status: DC | PRN
  Administered 2022-12-28 (×2): 25 ug via INTRAVENOUS

## 2022-12-28 MED ORDER — FENTANYL CITRATE (PF) 100 MCG/2ML IJ SOLN
INTRAMUSCULAR | Status: DC | PRN
  Administered 2022-12-28: 100 ug via INTRAVENOUS

## 2022-12-28 MED ORDER — GABAPENTIN 300 MG PO CAPS
ORAL_CAPSULE | ORAL | Status: AC
  Filled 2022-12-28: qty 1

## 2022-12-28 MED ORDER — GLYCOPYRROLATE 0.2 MG/ML IJ SOLN
INTRAMUSCULAR | Status: DC | PRN
  Administered 2022-12-28: .2 mg via INTRAVENOUS

## 2022-12-28 MED ORDER — SUGAMMADEX SODIUM 200 MG/2ML IV SOLN
INTRAVENOUS | Status: DC | PRN
  Administered 2022-12-28: 200 mg via INTRAVENOUS

## 2022-12-28 MED ORDER — MIDAZOLAM HCL 2 MG/2ML IJ SOLN
INTRAMUSCULAR | Status: DC | PRN
  Administered 2022-12-28: 2 mg via INTRAVENOUS

## 2022-12-28 MED ORDER — LACTATED RINGERS IV SOLN: INTRAVENOUS | Status: DC

## 2022-12-28 MED ORDER — PROPOFOL 1000 MG/100ML IV EMUL
INTRAVENOUS | Status: AC
  Filled 2022-12-28: qty 100

## 2022-12-28 MED ORDER — ONDANSETRON 4 MG PO TBDP
4.0000 mg | ORAL_TABLET | Freq: Three times a day (TID) | ORAL | 0 refills | Status: AC | PRN
Start: 2022-12-28 — End: 2023-01-07

## 2022-12-28 MED ORDER — OXYCODONE HCL 5 MG/5ML PO SOLN: 5.0000 mg | Freq: Once | ORAL | Status: AC | PRN

## 2022-12-28 MED ORDER — LIDOCAINE HCL (CARDIAC) PF 100 MG/5ML IV SOSY
PREFILLED_SYRINGE | INTRAVENOUS | Status: DC | PRN
  Administered 2022-12-28: 100 mg via INTRAVENOUS

## 2022-12-28 MED ORDER — FENTANYL CITRATE (PF) 100 MCG/2ML IJ SOLN
INTRAMUSCULAR | Status: AC
  Filled 2022-12-28: qty 2

## 2022-12-28 MED ORDER — ONDANSETRON HCL 4 MG/2ML IJ SOLN: 4.0000 mg | Freq: Once | INTRAMUSCULAR | Status: DC | PRN

## 2022-12-28 MED ORDER — MIDAZOLAM HCL 2 MG/2ML IJ SOLN
INTRAMUSCULAR | Status: AC
  Filled 2022-12-28: qty 2

## 2022-12-28 MED ORDER — HEMOSTATIC AGENTS (NO CHARGE) OPTIME
TOPICAL | Status: DC | PRN
  Administered 2022-12-28: 1 via TOPICAL

## 2022-12-28 MED ORDER — SCOPOLAMINE 1 MG/3DAYS TD PT72
1.0000 | MEDICATED_PATCH | TRANSDERMAL | Status: DC
  Administered 2022-12-28: 1.5 mg via TRANSDERMAL

## 2022-12-28 MED ORDER — DOCUSATE SODIUM 100 MG PO CAPS: 100.0000 mg | ORAL_CAPSULE | Freq: Two times a day (BID) | ORAL | 0 refills | Status: DC

## 2022-12-28 MED ORDER — CHLORHEXIDINE GLUCONATE 0.12 % MT SOLN
OROMUCOSAL | Status: AC
  Filled 2022-12-28: qty 15

## 2022-12-28 MED ORDER — DEXAMETHASONE SODIUM PHOSPHATE 10 MG/ML IJ SOLN
INTRAMUSCULAR | Status: DC | PRN
  Administered 2022-12-28: 10 mg via INTRAVENOUS

## 2022-12-28 MED ORDER — POVIDONE-IODINE 10 % EX SWAB
2.0000 | Freq: Once | CUTANEOUS | Status: AC
  Administered 2022-12-28: 2 via TOPICAL

## 2022-12-28 MED ORDER — BUPIVACAINE HCL (PF) 0.5 % IJ SOLN
INTRAMUSCULAR | Status: AC
  Filled 2022-12-28: qty 30

## 2022-12-28 MED ORDER — LACTATED RINGERS IV SOLN
INTRAVENOUS | Status: DC
Start: 2022-12-28 — End: 2022-12-28

## 2022-12-28 MED ORDER — PROPOFOL 10 MG/ML IV BOLUS
INTRAVENOUS | Status: DC | PRN
  Administered 2022-12-28: 200 mg via INTRAVENOUS
  Administered 2022-12-28: 150 ug/kg/min via INTRAVENOUS

## 2022-12-28 MED ORDER — ORAL CARE MOUTH RINSE: 15.0000 mL | Freq: Once | OROMUCOSAL | Status: AC

## 2022-12-28 MED ORDER — GABAPENTIN 300 MG PO CAPS: 300.0000 mg | ORAL_CAPSULE | Freq: Every day | ORAL | 0 refills | Status: DC

## 2022-12-28 MED ORDER — ACETAMINOPHEN 500 MG PO TABS
1000.0000 mg | ORAL_TABLET | ORAL | Status: AC
  Administered 2022-12-28: 1000 mg via ORAL

## 2022-12-28 MED ORDER — SUCCINYLCHOLINE CHLORIDE 200 MG/10ML IV SOSY
PREFILLED_SYRINGE | INTRAVENOUS | Status: DC | PRN
  Administered 2022-12-28: 120 mg via INTRAVENOUS

## 2022-12-28 MED ORDER — CEFAZOLIN IN SODIUM CHLORIDE 3-0.9 GM/100ML-% IV SOLN
3.0000 g | Freq: Once | INTRAVENOUS | Status: AC
  Administered 2022-12-28: 3 g via INTRAVENOUS
  Filled 2022-12-28: qty 100

## 2022-12-28 MED ORDER — ONDANSETRON HCL 4 MG/2ML IJ SOLN
INTRAMUSCULAR | Status: DC | PRN
  Administered 2022-12-28: 4 mg via INTRAVENOUS

## 2022-12-28 MED ORDER — ROCURONIUM BROMIDE 100 MG/10ML IV SOLN
INTRAVENOUS | Status: DC | PRN
  Administered 2022-12-28 (×2): 50 mg via INTRAVENOUS

## 2022-12-28 MED ORDER — CEFAZOLIN SODIUM-DEXTROSE 2-4 GM/100ML-% IV SOLN
INTRAVENOUS | Status: AC
  Filled 2022-12-28: qty 100

## 2022-12-28 MED ORDER — ACETAMINOPHEN 10 MG/ML IV SOLN: 1000.0000 mg | Freq: Once | INTRAVENOUS | Status: DC | PRN

## 2022-12-28 MED ORDER — DEXMEDETOMIDINE HCL IN NACL 80 MCG/20ML IV SOLN
INTRAVENOUS | Status: DC | PRN
  Administered 2022-12-28: 20 ug via INTRAVENOUS

## 2022-12-28 MED ORDER — SCOPOLAMINE 1 MG/3DAYS TD PT72
MEDICATED_PATCH | TRANSDERMAL | Status: AC
  Filled 2022-12-28: qty 1

## 2022-12-28 MED ORDER — KETOROLAC TROMETHAMINE 30 MG/ML IJ SOLN
INTRAMUSCULAR | Status: DC | PRN
  Administered 2022-12-28: 30 mg via INTRAVENOUS

## 2022-12-28 MED ORDER — GABAPENTIN 300 MG PO CAPS
300.0000 mg | ORAL_CAPSULE | ORAL | Status: AC
  Administered 2022-12-28: 300 mg via ORAL

## 2022-12-28 MED ORDER — BUPIVACAINE HCL (PF) 0.5 % IJ SOLN
INTRAMUSCULAR | Status: DC | PRN
  Administered 2022-12-28: 10 mL

## 2022-12-28 MED ORDER — 0.9 % SODIUM CHLORIDE (POUR BTL) OPTIME
TOPICAL | Status: DC | PRN
  Administered 2022-12-28: 500 mL

## 2022-12-28 MED ORDER — ACETAMINOPHEN EXTRA STRENGTH 500 MG PO TABS: 1000.0000 mg | ORAL_TABLET | Freq: Four times a day (QID) | ORAL | 0 refills | Status: AC

## 2022-12-28 MED ORDER — OXYCODONE HCL 5 MG PO TABS
ORAL_TABLET | ORAL | Status: AC
  Filled 2022-12-28: qty 1

## 2022-12-28 MED ORDER — CEFAZOLIN SODIUM-DEXTROSE 1-4 GM/50ML-% IV SOLN
INTRAVENOUS | Status: AC
  Filled 2022-12-28: qty 50

## 2022-12-28 SURGICAL SUPPLY — 60 items
APPLICATOR ARISTA FLEXITIP XL (MISCELLANEOUS) IMPLANT
BAG URINE DRAIN 2000ML AR STRL (UROLOGICAL SUPPLIES) ×2 IMPLANT
BLADE SURG SZ11 CARB STEEL (BLADE) ×2 IMPLANT
CANNULA CAP OBTURATR AIRSEAL 8 (CAP) ×2 IMPLANT
CATH ROBINSON RED A/P 16FR (CATHETERS) ×2 IMPLANT
CATH URTH 16FR FL 2W BLN LF (CATHETERS) ×2 IMPLANT
COUNTER NEEDLE 20/40 LG (NEEDLE) ×2 IMPLANT
COVER TIP SHEARS 8 DVNC (MISCELLANEOUS) ×2 IMPLANT
DERMABOND ADVANCED .7 DNX12 (GAUZE/BANDAGES/DRESSINGS) ×2 IMPLANT
DRAPE ARM DVNC X/XI (DISPOSABLE) ×8 IMPLANT
DRAPE COLUMN DVNC XI (DISPOSABLE) ×2 IMPLANT
DRAPE SHEET LG 3/4 BI-LAMINATE (DRAPES) ×2 IMPLANT
DRIVER NDL MEGA 8 DVNC XI (INSTRUMENTS) ×2 IMPLANT
DRIVER NDLE MEGA DVNC XI (INSTRUMENTS) ×2 IMPLANT
ELECT REM PT RETURN 9FT ADLT (ELECTROSURGICAL) ×2
ELECTRODE REM PT RTRN 9FT ADLT (ELECTROSURGICAL) ×2 IMPLANT
FORCEPS BPLR FENES DVNC XI (FORCEP) ×2 IMPLANT
GAUZE 4X4 16PLY ~~LOC~~+RFID DBL (SPONGE) ×2 IMPLANT
GLOVE BIO SURGEON STRL SZ7 (GLOVE) ×8 IMPLANT
GLOVE BIOGEL PI IND STRL 7.5 (GLOVE) ×8 IMPLANT
GLOVE INDICATOR 7.5 STRL GRN (GLOVE) ×2 IMPLANT
GOWN STRL REUS W/ TWL LRG LVL3 (GOWN DISPOSABLE) ×12 IMPLANT
HEMOSTAT ARISTA ABSORB 3G PWDR (HEMOSTASIS) IMPLANT
IRRIGATION STRYKERFLOW (MISCELLANEOUS) IMPLANT
IRRIGATOR STRYKERFLOW (MISCELLANEOUS)
IV LACTATED RINGERS 1000ML (IV SOLUTION) ×2 IMPLANT
IV NS 1000ML BAXH (IV SOLUTION) IMPLANT
KIT PINK PAD W/HEAD ARE REST (MISCELLANEOUS) ×2
KIT PINK PAD W/HEAD ARM REST (MISCELLANEOUS) ×2 IMPLANT
LABEL OR SOLS (LABEL) ×2 IMPLANT
MANIFOLD NEPTUNE II (INSTRUMENTS) ×2 IMPLANT
MANIPULATOR VCARE LG CRV RETR (MISCELLANEOUS) IMPLANT
MANIPULATOR VCARE SML CRV RETR (MISCELLANEOUS) IMPLANT
MANIPULATOR VCARE STD CRV RETR (MISCELLANEOUS) IMPLANT
NS IRRIG 1000ML POUR BTL (IV SOLUTION) ×2 IMPLANT
NS IRRIG 500ML POUR BTL (IV SOLUTION) ×2 IMPLANT
OBTURATOR OPTICAL STND 8 DVNC (TROCAR) ×2
OBTURATOR OPTICALSTD 8 DVNC (TROCAR) ×2 IMPLANT
OCCLUDER COLPOPNEUMO (BALLOONS) ×2 IMPLANT
PACK GYN LAPAROSCOPIC (MISCELLANEOUS) ×2 IMPLANT
PAD OB MATERNITY 11 LF (PERSONAL CARE ITEMS) ×2 IMPLANT
PAD PREP OB/GYN DISP 24X41 (PERSONAL CARE ITEMS) ×2 IMPLANT
SCISSORS MNPLR CVD DVNC XI (INSTRUMENTS) ×2 IMPLANT
SCRUB CHG 4% DYNA-HEX 4OZ (MISCELLANEOUS) ×2 IMPLANT
SEAL UNIV 5-12 XI (MISCELLANEOUS) ×6 IMPLANT
SEALER VESSEL EXT DVNC XI (MISCELLANEOUS) ×2 IMPLANT
SET CYSTO W/LG BORE CLAMP LF (SET/KITS/TRAYS/PACK) ×2 IMPLANT
SET TUBE FILTERED XL AIRSEAL (SET/KITS/TRAYS/PACK) ×2 IMPLANT
SOL ELECTROSURG ANTI STICK (MISCELLANEOUS) ×2
SOL PREP PVP 2OZ (MISCELLANEOUS) ×2
SOLUTION ELECTROSURG ANTI STCK (MISCELLANEOUS) ×2 IMPLANT
SOLUTION PREP PVP 2OZ (MISCELLANEOUS) ×2 IMPLANT
SURGILUBE 2OZ TUBE FLIPTOP (MISCELLANEOUS) ×2 IMPLANT
SUT MNCRL 4-0 27XMFL (SUTURE) ×2
SUT STRATA 2-0 30 CT-2 (SUTURE) ×2 IMPLANT
SUT VIC AB 0 CT2 27 (SUTURE) ×4 IMPLANT
SUT VLOC 90 0 VL 30X27 GL22 (SUTURE) IMPLANT
SUTURE MNCRL 4-0 27XMF (SUTURE) ×2 IMPLANT
TRAP FLUID SMOKE EVACUATOR (MISCELLANEOUS) ×2 IMPLANT
WATER STERILE IRR 500ML POUR (IV SOLUTION) ×2 IMPLANT

## 2022-12-28 NOTE — Anesthesia Preprocedure Evaluation (Signed)
Anesthesia Evaluation  Patient identified by MRN, date of birth, ID band Patient awake    Reviewed: Allergy & Precautions, NPO status , Patient's Chart, lab work & pertinent test results  History of Anesthesia Complications Negative for: history of anesthetic complications  Airway Mallampati: II  TM Distance: <3 FB Neck ROM: Full    Dental no notable dental hx. (+) Teeth Intact   Pulmonary asthma , sleep apnea , neg COPD, Patient abstained from smoking.Not current smoker   Pulmonary exam normal breath sounds clear to auscultation       Cardiovascular Exercise Tolerance: Good METS(-) hypertension(-) CAD and (-) Past MI negative cardio ROS (-) dysrhythmias  Rhythm:Regular Rate:Normal - Systolic murmurs    Neuro/Psych negative neurological ROS  negative psych ROS   GI/Hepatic ,GERD  Medicated,,(+)     (-) substance abuse    Endo/Other  neg diabetes  Class 3 obesity  Renal/GU negative Renal ROS     Musculoskeletal   Abdominal  (+) + obese  Peds  Hematology   Anesthesia Other Findings Past Medical History: No date: Abnormal uterine bleeding (AUB) No date: Asthma     Comment:  exercise induced No date: Chicken pox No date: GERD (gastroesophageal reflux disease) No date: Hyperkalemia No date: Kidney stones No date: Leukocytosis No date: Morbid obesity (HCC) No date: OSA on CPAP No date: Uterine fibroid  Reproductive/Obstetrics                              Anesthesia Physical Anesthesia Plan  ASA: 3  Anesthesia Plan: General   Post-op Pain Management: Gabapentin PO (pre-op)*, Tylenol PO (pre-op)* and Toradol IV (intra-op)*   Induction: Intravenous  PONV Risk Score and Plan: 4 or greater and Ondansetron, Dexamethasone, Scopolamine patch - Pre-op, Midazolam, TIVA and Propofol infusion  Airway Management Planned: Oral ETT and Video Laryngoscope Planned  Additional Equipment:  None  Intra-op Plan:   Post-operative Plan: Extubation in OR  Informed Consent: I have reviewed the patients History and Physical, chart, labs and discussed the procedure including the risks, benefits and alternatives for the proposed anesthesia with the patient or authorized representative who has indicated his/her understanding and acceptance.     Dental advisory given  Plan Discussed with: CRNA and Surgeon  Anesthesia Plan Comments: (Discussed risks of anesthesia with patient, including PONV, sore throat, lip/dental/eye damage. Rare risks discussed as well, such as cardiorespiratory and neurological sequelae, and allergic reactions. Discussed the role of CRNA in patient's perioperative care. Patient understands. Patient informed about increased incidence of above perioperative risk due to high BMI. Patient understands.  )         Anesthesia Quick Evaluation

## 2022-12-28 NOTE — H&P (Signed)
Catherine Fields is a 37 y.o. G0P0000 female presenting with Pre Op Consulting (Sign consents)   History of Present Illness: Patient returns today for preop visit for hysterectomy for management of her bleeding, pain and fibroid uterus.    EMBx 11/2022  Chronic endometritis    TVUS 11/2022 Uterus 7.2 x 4.5 x 3.6 cm  Endometrium 8.7 mm  Fibroids: mid anterior right next to endometrium 1.2 cm and Lt fundal pedunculated 2.8 cm  Bilateral ovaries with simple cysts largest 4 cm     Workup: Pap: negative, negative HPV 08/2021 EMBx:Part A-Endometrial Biopsy: CHRONIC ENDOMETRITIS.      Past Medical History:  has a past medical history of Asthma without status asthmaticus (HHS-HCC), Chicken pox, GERD (gastroesophageal reflux disease), Kidney stones, and Sleep apnea.  Past Surgical History:  has a past surgical history that includes egd (02/07/2015). Family History: family history includes Asthma in her mother; Diabetes in her father; Fibroids in her maternal grandmother and mother; Glaucoma in her father; Heart disease in her father; High blood pressure (Hypertension) in her father; Prostate cancer in her maternal grandfather. Social History:  reports that she has never smoked. She has never used smokeless tobacco. She reports current alcohol use of about 1.0 standard drink of alcohol per week. She reports that she does not use drugs. OB/GYN History:  OB History       Gravida  0   Para  0   Term  0   Preterm  0   AB  0   Living  0        SAB  0   IAB  0   Ectopic  0   Molar  0   Multiple  0   Live Births  0           Allergies: is allergic to ciprofloxacin, semaglutide, and sulfa (sulfonamide antibiotics). Medications: Current Medications  Current Outpatient Medications:    albuterol 90 mcg/actuation inhaler, Inhale 2 inhalations into the lungs every 6 (six) hours as needed for Wheezing., Disp: 1 Inhaler, Rfl: 4   EPIPEN 2-PAK 0.3 mg/0.3 mL pen injector, Inject 0.3 mg  into the muscle as needed.  , Disp: , Rfl: 0   fluticasone (FLONASE) 50 mcg/actuation nasal spray, Place 2 sprays into both nostrils once daily., Disp: , Rfl:    loratadine (CLARITIN) 10 mg tablet, Take 10 mg by mouth once daily., Disp: , Rfl:    pantoprazole (PROTONIX) 20 MG DR tablet, Take 20 mg by mouth once daily., Disp: , Rfl:    WEGOVY 0.25 mg/0.5 mL pen injector, , Disp: , Rfl:      Review of Systems: No SOB, no palpitations or chest pain, no new lower extremity edema, no nausea or vomiting or bowel or bladder complaints. See HPI for gyn specific ROS.    Exam:    BP 130/80   Ht 167.6 cm (5\' 6" )   Wt (!) 138.3 kg (305 lb)   BMI 49.23 kg/m      Constitutional:  General appearance: Well nourished, well developed female in no acute distress.  CV: RRR Pulm: CTAB Neuro/psych:  Normal mood and affect. No gross motor deficits. Neck:  Supple, normal appearance.  Respiratory:  Normal respiratory effort, no use of accessory muscles Skin:  No visible rashes or external lesions      Impression:    The primary encounter diagnosis was Excessive or frequent menstruation. A diagnosis of Intramural leiomyoma of uterus was also pertinent to this visit.  Plan:    1.  -Patient returns for a preoperative discussion regarding her plans to proceed with surgical treatment of her heavy bleeding and fibroids by total laparoscopic hysterectomy with bilateral salpingectomy procedure.  We may perform a cystoscopy to evaluate the urinary tract after the procedure, if surgically indicated for uro tract integrity.    The patient and I discussed the technical aspects of the procedure including the potential for risks and complications.  These include but are not limited to the risk of infection requiring post-operative antibiotics or further procedures.  We talked about the risk of injury to adjacent organs including bladder, bowel, ureter, blood vessels or nerves.  We talked about the need to convert to  an open incision.  We talked about the possible need for blood transfusion.  We talked about postop complications such as thromboembolic or cardiopulmonary complications.  All of her questions were answered.  Her preoperative exam was completed and the appropriate consents were signed. She is scheduled to undergo this procedure in the near future.     Diagnoses and all orders for this visit:   Excessive or frequent menstruation   Intramural leiomyoma of uterus

## 2022-12-28 NOTE — Transfer of Care (Signed)
Immediate Anesthesia Transfer of Care Note  Patient: Catherine Fields  Procedure(s) Performed: XI ROBOTIC ASSISTED LAPAROSCOPIC HYSTERECTOMY AND SALPINGECTOMY (Bilateral) CYSTOSCOPY  Patient Location: PACU  Anesthesia Type:General  Level of Consciousness: awake  Airway & Oxygen Therapy: Patient Spontanous Breathing  Post-op Assessment: Report given to RN and Post -op Vital signs reviewed and stable  Post vital signs: Reviewed and stable  Last Vitals:  Vitals Value Taken Time  BP 137/91 12/28/22 1240  Temp    Pulse 77 12/28/22 1244  Resp 15 12/28/22 1244  SpO2 99 % 12/28/22 1244  Vitals shown include unfiled device data.  Last Pain:  Vitals:   12/28/22 0955  PainSc: 0-No pain         Complications: No notable events documented.

## 2022-12-28 NOTE — Discharge Instructions (Signed)
Discharge instructions after  robotically-assisted total laparoscopic hysterectomy   For the next three days, take ibuprofen and acetaminophen on a schedule, every 8 hours. You can take them together or you can intersperse them, and take one every four hours. I also gave you gabapentin for nighttime, to help you sleep and also to control pain. Take gabapentin medicines at night for at least the next 3 nights. You also have a narcotic, oxycodone, to take as needed if the above medicines don't help.  Postop constipation is a major cause of pain. Stay well hydrated, walk as you tolerate, and take over the counter senna as well as stool softeners if you need them.   Signs and Symptoms to Report Call our office at [phone] if you have any of the following.   Fever over 100.4 degrees or higher  Severe stomach pain not relieved with pain medications  Bright red bleeding that's heavier than a period that does not slow with rest  To go the bathroom a lot (frequency), you can't hold your urine (urgency), or it hurts when you empty your bladder (urinate)  Chest pain  Shortness of breath  Pain in the calves of your legs  Severe nausea and vomiting not relieved with anti-nausea medications  Signs of infection around your wounds, such as redness, hot to touch, swelling, green/yellow drainage (like pus), bad smelling discharge  Any concerns  What You Can Expect after Surgery  You may see some pink tinged, bloody fluid and bruising around the wound. This is normal.  You may notice shoulder and neck pain. This is caused by the gas used during surgery to expand your abdomen so your surgeon could get to the uterus easier.  You may have a sore throat because of the tube in your mouth during general anesthesia. This will go away in 2 to 3 days.  You may have some stomach cramps.  You may notice spotting on your panties.  You may have pain around the incision sites.   Activities after Your  Discharge Follow these guidelines to help speed your recovery at home:  Do the coughing and deep breathing as you did in the hospital for 2 weeks. Use the small blue breathing device, called the incentive spirometer for 2 weeks.  Don't drive if you are in pain or taking narcotic pain medicine. You may drive when you can safely slam on the brakes, turn the wheel forcefully, and rotate your torso comfortably. This is typically 1-2 weeks. Practice in a parking lot or side street prior to attempting to drive regularly.   Ask others to help with household chores for 4 weeks.  Do not lift anything heavier that 10 pounds for 4-6 weeks. This includes pets, children, and groceries.  Don't do strenuous activities, exercises, or sports like vacuuming, tennis, squash, etc. until your doctor says it is safe to do so. ---Maintain pelvic rest for 12 weeks. This means nothing in the vagina or rectum at all (no douching, tampons, intercourse) for 12 weeks.   Walk as you feel able. Rest often since it may take two or three weeks for your energy level to return to normal.   You may climb stairs  Avoid constipation:   -Eat fruits, vegetables, and whole grains. Eat small meals as your appetite will take time to return to normal.   -Drink 6 to 8 glasses of water each day unless your doctor has told you to limit your fluids.   -Use a laxative or  stool softener as needed if constipation becomes a problem. You may take Miralax, metamucil, Citrucil, Colace, Senekot, FiberCon, etc. If this does not relieve the constipation, try two tablespoons of Milk Of Magnesia every 8 hours until your bowels move.   You may shower. Gently wash the wounds with a mild soap and water. Pat dry.  Do not get in a hot tub, swimming pool, etc. for 6 weeks.  Do not use lotions, oils, powders on the wounds.  Do not douche, use tampons, or have sex until your doctor says it is okay.  Take your pain medicine when you need it. The medicine may not  work as well if the pain is bad.  Take the medicines you were taking before surgery. Other medications you will need are pain medications (Norco or Percocet) and nausea medications (Zofran).

## 2022-12-28 NOTE — Anesthesia Procedure Notes (Signed)
Procedure Name: Intubation Date/Time: 12/28/2022 10:37 AM  Performed by: Lysbeth Penner, CRNAPre-anesthesia Checklist: Patient identified, Emergency Drugs available, Suction available and Patient being monitored Patient Re-evaluated:Patient Re-evaluated prior to induction Oxygen Delivery Method: Circle system utilized Preoxygenation: Pre-oxygenation with 100% oxygen Induction Type: IV induction Ventilation: Mask ventilation without difficulty Laryngoscope Size: McGrath and 3 Grade View: Grade I Tube type: Oral Tube size: 6.5 mm Number of attempts: 1 Airway Equipment and Method: Stylet and Oral airway Placement Confirmation: ETT inserted through vocal cords under direct vision, positive ETCO2 and breath sounds checked- equal and bilateral Secured at: 21 cm Tube secured with: Tape Dental Injury: Teeth and Oropharynx as per pre-operative assessment

## 2022-12-28 NOTE — Interval H&P Note (Signed)
History and Physical Interval Note:  12/28/2022 9:49 AM  Catherine Fields  has presented today for surgery, with the diagnosis of abnormal uterine bleeding, fibroid.  The various methods of treatment have been discussed with the patient and family. After consideration of risks, benefits and other options for treatment, the patient has consented to  Procedure(s): XI ROBOTIC ASSISTED LAPAROSCOPIC HYSTERECTOMY AND SALPINGECTOMY (Bilateral) CYSTOSCOPY (N/A) as a surgical intervention.  The patient's history has been reviewed, patient examined, no change in status, stable for surgery.  I have reviewed the patient's chart and labs.  Questions were answered to the patient's satisfaction.     Christeen Douglas

## 2022-12-28 NOTE — Op Note (Signed)
Catherine Fields PROCEDURE DATE: 12/28/2022  PREOPERATIVE DIAGNOSIS: Menorrhagia, chronic pelvic pain - fibroids POSTOPERATIVE DIAGNOSIS: The same PROCEDURE:  XI ROBOTIC ASSISTED LAPAROSCOPIC HYSTERECTOMY AND SALPINGECTOMY:  CYSTOSCOPY: 52000 (CPT)  SURGEON:  Dr. Christeen Douglas, MD ASSISTANT: RNFA Anesthesiologist:  Anesthesiologist: Corinda Gubler, MD CRNA: Katherine Basset, CRNA; Lysbeth Penner, CRNA  INDICATIONS: 37 y.o. F  here for definitive surgical management secondary to the indications listed under preoperative diagnoses; please see preoperative note for further details.  Risks of surgery were discussed with the patient including but not limited to: bleeding which may require transfusion or reoperation; infection which may require antibiotics; injury to bowel, bladder, ureters or other surrounding organs; need for additional procedures; thromboembolic phenomenon, incisional problems and other postoperative/anesthesia complications. Written informed consent was obtained.    FINDINGS:    External genitalia, vaginal canal and cervix negative for lesions. Intraoperative findings revealed a normal upper abdomen including bowel, liver, diaphragmatic surfaces, stomach, and omentum.   The uterus was somewhat enlarged, with a subserosal fundal fibroid, and mobile.  The right and left ovaries appeared normal with large ovarian cysts Bilateral tubes appeared normal.  Appendix not visualized   ANESTHESIA:    General INTRAVENOUS FLUIDS:400  ml ESTIMATED BLOOD LOSS: minimal URINE OUTPUT: 400 ml  SPECIMENS: Uterus, cervix, bilateral fallopian tubes COMPLICATIONS: None immediate   RATLH/BS:   PROCEDURE IN DETAIL: After informed consent was obtained, the patient was taken to the operating room where general anesthesia was obtained without difficulty. The patient was positioned in the dorsal lithotomy position in Redwood stirrups and her arms were carefully tucked at her sides and the  usual precautions were taken. Deep Trendelenburg (20-25 deg) was established to confirm that she does not shift on the table.  She was prepped and draped in normal sterile fashion.  Time-out was performed and a Foley catheter was placed into the bladder. A standard VCare uterine manipulator was then placed in the uterus without incident.  Preoperative prophylactic antibiotics were given through her iv, 3 g ancef.  After infiltration of local anesthetic at the proposed trocar sites, an 8 mm incision was created at the umbilicus, and an AirSeal 5mm was placed under direct visualization, after confirmation of OG tube working well. Pneumoperitoneum was created to a pressure of 15 mm Hg. The camera was placed and the abdomin surveyed, noting intact bowel below the site of entry. A survey of the pelvis and upper abdomen revealed the above findings. One right and one left lateral 8-mm robotic ports were placed under direct visualization.  The patient was placed in deepTrendelenburg and the bowel was displaced up into the upper abdomen. The robot was left side docked. The instruments were placed under direct visualization.   The ureters were identified bilaterally coursing outside of the operative field. Round ligaments were divided on each side with the EndoShears and the retroperitoneal space was opened bilaterally. The posterior leaflet of the broad was taken down to the level of the IP ligament. The anterior leaflet of the broad ligament was carefully taken down to the midline.  A bladder flap was created and the bladder was dissected down off the lower uterine segment and cervix using endoshears and electrocautery.   The Fallopian tubes were divided from the ovaries, and care taken to hemostatically transect the utero-ovarian ligament. The peritoneum was taken down to the level of the internal os, and the uterine arteries skeletonized. With strong cephalad pressure from the V-care, bipolar cautery was used to  seal and transect  the uterine arteries, and the pedicles allowed to fall away laterally.  A colpotomy was performed circumferentially along the V-Care ring with monopolar electrocautery and the cervix was incised from the vagina using the laparoscopic scissors. The specimen was removed through the vagina.  A pneumo balloon was placed in the vagina and the vaginal cuff was then closed in a running continuous fashion using the  0 V-Lock suture with careful attention to include the vaginal cuff angles, the uterosacral ligaments and the vaginal mucosa within the closure.  Hemostasis was secured with intraabdominal pressure and review of all surgical sites. The intraperitoneal pressure was dropped, and all planes of dissection, vascular pedicles and the vaginal cuff were found to be hemostatic.  Arista placed.  The robot was undocked.   Attention was turned to the bladder and cystoscopy showed vigorous bilateral ureteral jets.  No stitches were visualized in the bladder during cystoscopy.  No endometriosis or interstitial cystitis or Hunner's ulcers were noted in the bladder to explain her pelvic pain.  The lateral trocars were removed under visualization.  The CO2 gas was released and several deep breaths given to remove any remaining CO2 from the peritoneal cavity.  The skin incisions were closed with 4-0 Monocryl subcuticular stitch and Dermabond.    Anesthesia was reversed without difficulty.  The patient tolerated the procedure well.  Sponge, lap and needle counts were correct x2.  The patient was taken to recovery room in excellent condition.

## 2022-12-28 NOTE — Anesthesia Postprocedure Evaluation (Signed)
Anesthesia Post Note  Patient: [name]  Procedure(s) Performed: XI ROBOTIC ASSISTED LAPAROSCOPIC HYSTERECTOMY AND SALPINGECTOMY (Bilateral) CYSTOSCOPY  Patient location during evaluation: PACU Anesthesia Type: General Level of consciousness: awake and alert Pain management: pain level controlled Vital Signs Assessment: post-procedure vital signs reviewed and stable Respiratory status: spontaneous breathing, nonlabored ventilation, respiratory function stable and patient connected to nasal cannula oxygen Cardiovascular status: blood pressure returned to baseline and stable Postop Assessment: no apparent nausea or vomiting Anesthetic complications: no   There were no known notable events for this encounter.   Last Vitals:  Vitals:   12/28/22 1315 12/28/22 1338  BP: (!) 133/92 101/77  Pulse: 71 (!) 58  Resp: 19 18  Temp:  (!) 36.2 C  SpO2: 99% 99%    Last Pain:  Vitals:   12/28/22 1338  TempSrc: Temporal  PainSc: 6                  Corinda Gubler

## 2022-12-29 ENCOUNTER — Encounter: Payer: Self-pay | Admitting: Obstetrics and Gynecology

## 2023-01-08 LAB — SURGICAL PATHOLOGY

## 2023-02-05 ENCOUNTER — Encounter: Payer: BC Managed Care – PPO | Admitting: Dermatology

## 2023-03-12 ENCOUNTER — Ambulatory Visit: Payer: BC Managed Care – PPO | Admitting: Dermatology

## 2023-03-12 DIAGNOSIS — L814 Other melanin hyperpigmentation: Secondary | ICD-10-CM

## 2023-03-12 DIAGNOSIS — D239 Other benign neoplasm of skin, unspecified: Secondary | ICD-10-CM

## 2023-03-12 DIAGNOSIS — Z7189 Other specified counseling: Secondary | ICD-10-CM

## 2023-03-12 DIAGNOSIS — D229 Melanocytic nevi, unspecified: Secondary | ICD-10-CM

## 2023-03-12 DIAGNOSIS — W908XXA Exposure to other nonionizing radiation, initial encounter: Secondary | ICD-10-CM

## 2023-03-12 DIAGNOSIS — L578 Other skin changes due to chronic exposure to nonionizing radiation: Secondary | ICD-10-CM

## 2023-03-12 DIAGNOSIS — D2372 Other benign neoplasm of skin of left lower limb, including hip: Secondary | ICD-10-CM

## 2023-03-12 DIAGNOSIS — L820 Inflamed seborrheic keratosis: Secondary | ICD-10-CM | POA: Diagnosis not present

## 2023-03-12 DIAGNOSIS — D225 Melanocytic nevi of trunk: Secondary | ICD-10-CM

## 2023-03-12 DIAGNOSIS — D2239 Melanocytic nevi of other parts of face: Secondary | ICD-10-CM

## 2023-03-12 DIAGNOSIS — D1801 Hemangioma of skin and subcutaneous tissue: Secondary | ICD-10-CM

## 2023-03-12 DIAGNOSIS — D2222 Melanocytic nevi of left ear and external auricular canal: Secondary | ICD-10-CM

## 2023-03-12 DIAGNOSIS — Z1283 Encounter for screening for malignant neoplasm of skin: Secondary | ICD-10-CM | POA: Diagnosis not present

## 2023-03-12 DIAGNOSIS — L821 Other seborrheic keratosis: Secondary | ICD-10-CM

## 2023-03-12 NOTE — Progress Notes (Unsigned)
 Follow-Up Visit   Subjective  Catherine Fields is a 38 y.o. female who presents for the following: Skin Cancer Screening and Full Body Skin Exam  The patient presents for Total-Body Skin Exam (TBSE) for skin cancer screening and mole check. The patient has spots, moles and lesions to be evaluated, some may be new or changing. She has a spot on her right forearm that she picks at.    The following portions of the chart were reviewed this encounter and updated as appropriate: medications, allergies, medical history  Review of Systems:  No other skin or systemic complaints except as noted in HPI or Assessment and Plan.  Objective  Well appearing patient in no apparent distress; mood and affect are within normal limits.  A full examination was performed including scalp, head, eyes, ears, nose, lips, neck, chest, axillae, abdomen, back, buttocks, bilateral upper extremities, bilateral lower extremities, hands, feet, fingers, toes, fingernails, and toenails. All findings within normal limits unless otherwise noted below.   Relevant physical exam findings are noted in the Assessment and Plan.  R forearm x Erythematous stuck-on, waxy papule or plaque  Assessment & Plan   SKIN CANCER SCREENING PERFORMED TODAY.  ACTINIC DAMAGE - Chronic condition, secondary to cumulative UV/sun exposure - diffuse scaly erythematous macules with underlying dyspigmentation - Recommend daily broad spectrum sunscreen SPF 30+ to sun-exposed areas, reapply every 2 hours as needed.  - Staying in the shade or wearing long sleeves, sun glasses (UVA+UVB protection) and wide brim hats (4-inch brim around the entire circumference of the hat) are also recommended for sun protection.  - Call for new or changing lesions.  LENTIGINES, SEBORRHEIC KERATOSES, HEMANGIOMAS - Benign normal skin lesions - Benign-appearing - Call for any changes  MELANOCYTIC NEVI - Tan-brown and/or pink-flesh-colored symmetric macules and  papules - right upper abdomen 2 mm medium dark brown macule with notch, stable compared to photo 01/24/2021   - Left Flank 2 mm medium dark brown macule    - Left Antihelix 2.5 mm gray brown macule, stable compared to photo 01/24/2021    - right mid cheek 9 x 0.7 mm indistinct firm flesh/hypopigmented flat papule with central light tan pigment, cryotherapy in the past. Photo taken today.    - Benign appearing on exam today - Observation - Call clinic for new or changing moles - Recommend daily use of broad spectrum spf 30+ sunscreen to sun-exposed areas.   Dermatofibroma- bx proven left upper pretibia Vs cyst at left neck  - Firm pink/brown papulenodule with dimple sign  - Benign appearing.  A dermatofibroma is a benign growth possibly related to trauma, such as an insect bite, cut from shaving, or inflamed acne-type bump.  Treatment options to remove include shave or excision with resulting scar and risk of recurrence.  Since not bothersome, will observe for now.  - Call for any changes    INFLAMED SEBORRHEIC KERATOSIS R forearm x Symptomatic, irritating, patient would like treated. Destruction of lesion - R forearm x  Destruction method: cryotherapy   Informed consent: discussed and consent obtained   Lesion destroyed using liquid nitrogen: Yes   Region frozen until ice ball extended beyond lesion: Yes   Outcome: patient tolerated procedure well with no complications   Post-procedure details: wound care instructions given   Additional details:  Prior to procedure, discussed risks of blister formation, small wound, skin dyspigmentation, or rare scar following cryotherapy. Recommend Vaseline ointment to treated areas while healing.  Return in about 1 year (around  03/11/2024) for TBSE.  ICherlyn Labella, CMA, am acting as scribe for Willeen Niece, MD .   Documentation: I have reviewed the above documentation for accuracy and completeness, and I agree with the above.  Willeen Niece, MD

## 2023-03-12 NOTE — Patient Instructions (Addendum)

## 2023-04-25 ENCOUNTER — Other Ambulatory Visit (HOSPITAL_BASED_OUTPATIENT_CLINIC_OR_DEPARTMENT_OTHER): Payer: Self-pay

## 2023-04-25 ENCOUNTER — Encounter: Payer: Self-pay | Admitting: Internal Medicine

## 2023-04-25 ENCOUNTER — Other Ambulatory Visit: Payer: Self-pay | Admitting: Pharmacist

## 2023-04-25 NOTE — Progress Notes (Signed)
 Patient currently on Wegovy  for weight management. Reports adverse effects including tongue tingling and would like to explore possible coverage of alternative, Zepbound.   Notably, prior symptoms on Wegovy  with onset after multiple successful weeks on therapy. Tongue tingling noted ~24 after injection suggesting possibly unrelated to the medication/ingredients. Possible this was secondary to something environmental or ingested. No skin or airway involvement.   With GLP1 the onset of immediate hypersensitivity reactions to this drug class is generally rapid, occurring within 1 hour of administration but up to six hours after exposure in some cases.  Certainly not unreasonable to consider trial of Mounjaro at lowest dose and close monitoring for side effects.   Prior Authorization:   Medication: Zepbound Prior Authorization Submitted: 04/25/23 CMM Code: N/a "PA Not Required"  CASE KEY [id number]   Test Claim: Unsuccessful, [employer] on file from 2024.

## 2023-04-25 NOTE — Telephone Encounter (Signed)
 Hey! Dr Geralyn Knee is out of the office today. This patient had tongue tingling while taking wegovy . She is interested in zepbound. Would this be an option for her. She had no other symptoms while on the Wegovy .

## 2023-04-26 NOTE — Telephone Encounter (Signed)
 Ok to schedule an appt

## 2023-05-03 ENCOUNTER — Encounter: Payer: Self-pay | Admitting: Internal Medicine

## 2023-05-03 ENCOUNTER — Telehealth: Admitting: Internal Medicine

## 2023-05-03 VITALS — Ht 65.0 in | Wt 312.8 lb

## 2023-05-03 DIAGNOSIS — Z713 Dietary counseling and surveillance: Secondary | ICD-10-CM

## 2023-05-03 NOTE — Progress Notes (Unsigned)
 Patient ID: Catherine Fields, female   DOB: 1985-01-26, 38 y.o.   MRN: [id number]   Virtual Visit via vidoe Note  I connected with Catherine Fields by a video enabled telemedicine application and verified that I am speaking with the correct person using two identifiers. Location patient: home Location provider: work  Persons participating in the virtual visit: patient, provider  The limitations, risks, security and privacy concerns of performing an evaluation and management service by video and the availability of in person appointments have been discussed. It has also been discussed with the patient that there may be a patient responsible charge related to this service. The patient expressed understanding and agreed to proceed.  Interactive audio and video telecommunications were attempted between this provider and patient, however failed, due to patient having technical difficulties OR patient did not have access to video capability.  We continued and completed visit with audio only. ***  Reason for visit: work in appt  HPI: Work in to discuss starting zepbound. She was previously on wegovy . In reviewing, she had no problems taking her first dose of wegovy . One day after taking her second dose of wegovy , she noticed some right side tongue tingling. [place] was in the airport and felt was related to some anxiety regarding flying. Resolved 6 hours after it started. After her third dose, she noticed the same sensation on her tongue and 1/2 of her lower lip felt a little numb 24 hours after taking. No other symptoms. No tip swelling. No rash. No tongue swelling.    ROS: See pertinent positives and negatives per HPI.  Past Medical History:  Diagnosis Date   Abnormal uterine bleeding (AUB)    Asthma    exercise induced   Chicken pox    GERD (gastroesophageal reflux disease)    Hyperkalemia    Kidney stones    Leukocytosis    Morbid obesity (HCC)    OSA on CPAP    Uterine fibroid     Past  Surgical History:  Procedure Laterality Date   CYSTOSCOPY N/A 12/28/2022   Procedure: CYSTOSCOPY;  Surgeon: Prescilla Brod, MD;  Location: [facility] ORS;  Service: Gynecology;  Laterality: N/A;   ESOPHAGOGASTRODUODENOSCOPY (EGD) WITH PROPOFOL  N/A 02/07/2015   Procedure: ESOPHAGOGASTRODUODENOSCOPY (EGD) WITH PROPOFOL ;  Surgeon: Luella Sager, MD;  Location: [facility] END[facility];  Service: Endoscopy;  Laterality: N/A;   ROBOTIC ASSISTED LAPAROSCOPIC HYSTERECTOMY AND SALPINGECTOMY Bilateral 12/28/2022   Procedure: XI ROBOTIC ASSISTED LAPAROSCOPIC HYSTERECTOMY AND SALPINGECTOMY;  Surgeon: Prescilla Brod, MD;  Location: [facility] ORS;  Service: Gynecology;  Laterality: Bilateral;    Family History  Problem Relation Age of Onset   Asthma Mother    Depression Mother    Obesity Mother    Arthritis Mother    Heart attack Mother    Heart disease Mother    Hyperlipidemia Father    Depression Father    Diabetes Father    Hearing loss Father    Hypertension Father    Mental illness Father    Cancer Maternal Grandmother    Bone cancer Maternal Grandfather     SOCIAL HX: ***   Current Outpatient Medications:    albuterol  (VENTOLIN  HFA) 108 (90 Base) MCG/ACT inhaler, Inhale 1-2 puffs into the lungs every 6 (six) hours as needed for wheezing or shortness of breath (Pt has not had to use in years)., Disp: , Rfl:    calcium carbonate (TUMS - DOSED IN MG ELEMENTAL CALCIUM) 500 MG chewable tablet, Chew 1 tablet by mouth as needed  for indigestion or heartburn., Disp: , Rfl:    ibuprofen (ADVIL) 200 MG tablet, Take 400 mg by mouth every 6 (six) hours as needed for moderate pain (pain score 4-6)., Disp: , Rfl:    loratadine (CLARITIN) 10 MG tablet, Take 10 mg by mouth daily as needed for allergies., Disp: , Rfl:    omeprazole (PRILOSEC OTC) 20 MG tablet, Take 20 mg by mouth as needed., Disp: , Rfl:   EXAM:  VITALS per patient if applicable:  GENERAL: alert, oriented, appears well and in no acute  distress  HEENT: atraumatic, conjunttiva clear, no obvious abnormalities on inspection of external nose and ears  NECK: normal movements of the head and neck  LUNGS: on inspection no signs of respiratory distress, breathing rate appears normal, no obvious gross SOB, gasping or wheezing  CV: no obvious cyanosis  MS: moves all visible extremities without noticeable abnormality  PSYCH/NEURO: pleasant and cooperative, no obvious depression or anxiety, speech and thought processing grossly intact  ASSESSMENT AND PLAN:  Discussed the following assessment and plan:  Problem List Items Addressed This Visit   None   No follow-ups on file.   I discussed the assessment and treatment plan with the patient. The patient was provided an opportunity to ask questions and all were answered. The patient agreed with the plan and demonstrated an understanding of the instructions.   The patient was advised to call back or seek an in-person evaluation if the symptoms worsen or if the condition fails to improve as anticipated.  I provided *** minutes of non-face-to-face time during this encounter.   Dellar Fenton, MD

## 2023-05-05 ENCOUNTER — Other Ambulatory Visit: Payer: Self-pay

## 2023-05-05 ENCOUNTER — Encounter: Payer: Self-pay | Admitting: Internal Medicine

## 2023-05-05 DIAGNOSIS — Z713 Dietary counseling and surveillance: Secondary | ICD-10-CM | POA: Insufficient documentation

## 2023-05-05 MED ORDER — TIRZEPATIDE-WEIGHT MANAGEMENT 2.5 MG/0.5ML ~~LOC~~ SOAJ
2.5000 mg | SUBCUTANEOUS | 2 refills | Status: DC
  Filled 2023-05-05 – 2023-05-13 (×2): qty 2, 28d supply, fill #0

## 2023-05-05 NOTE — Assessment & Plan Note (Addendum)
 Recent trial of wegovy .  Tongue/lip sensation as outlined. Symptoms only involved 1/2 of her tongue and lip. No swelling. No rash. No throat involvement. Had possibly noticed a similar sensation prior to starting wegovy . Would like a trial of zepbound. Really wants to lose weight and feels would benefit from the medication. Discussed diet and exercise. Discussed the uncertainty if her symptoms were a reaction to the medication. Discussed uncertainty of a possible reaction with zepbound. Discussed the possiblity of the reaction could be a more severe reaction. Discussed benefits and side effects of the medication. She would benefit from weight loss.  She understands risk and possible reactions to zepbound and desires to proceed with a trial of the medication.  Discussed recommendation for her to come to our office for her first couple of injections, so that she can be monitored. She is in agreement. Rx for zepbound to [facility] pharmacy. Needs to continue diet and exercise. Stay hydrated. Keep bowels moving.

## 2023-05-06 ENCOUNTER — Other Ambulatory Visit: Payer: Self-pay

## 2023-05-10 ENCOUNTER — Telehealth: Payer: Self-pay

## 2023-05-10 ENCOUNTER — Other Ambulatory Visit (HOSPITAL_COMMUNITY): Payer: Self-pay

## 2023-05-10 NOTE — Telephone Encounter (Signed)
 Pharmacy Patient Advocate Encounter   Received notification from CoverMyMeds that prior authorization for Zepbound  2.5MG /0.5ML pen-injectors is required/requested.   Insurance verification completed.   The patient is insured through [facility] .   Per test claim: PA required; PA submitted to above mentioned insurance via CoverMyMeds Key/confirmation #/EOC [id number] Status is pending

## 2023-05-10 NOTE — Telephone Encounter (Signed)
 PA needed for Zepbound

## 2023-05-13 ENCOUNTER — Ambulatory Visit: Admitting: Sleep Medicine

## 2023-05-13 ENCOUNTER — Other Ambulatory Visit: Payer: Self-pay

## 2023-05-13 ENCOUNTER — Other Ambulatory Visit (HOSPITAL_COMMUNITY): Payer: Self-pay

## 2023-05-13 ENCOUNTER — Encounter: Payer: Self-pay | Admitting: Sleep Medicine

## 2023-05-13 VITALS — BP 122/80 | HR 102 | Temp 97.7°F | Ht 65.5 in | Wt 321.8 lb

## 2023-05-13 DIAGNOSIS — Z9981 Dependence on supplemental oxygen: Secondary | ICD-10-CM

## 2023-05-13 DIAGNOSIS — Z6841 Body Mass Index (BMI) 40.0 and over, adult: Secondary | ICD-10-CM | POA: Diagnosis not present

## 2023-05-13 DIAGNOSIS — G4733 Obstructive sleep apnea (adult) (pediatric): Secondary | ICD-10-CM

## 2023-05-13 NOTE — Telephone Encounter (Signed)
 Pt scheduled

## 2023-05-13 NOTE — Progress Notes (Addendum)
 Name:Catherine Fields MRN: [id number] DOB: 1985-09-18   CHIEF COMPLAINT:  CPAP F/U   HISTORY OF PRESENT ILLNESS:  Catherine Fields is a 38 y.o. w/ a h/o OSA, asthma, GERD and morbid obesity who presents for CPAP F/U visit. Reports using CPAP therapy every night, which is confirmed by compliance data. She is currently using the Airfit P10 nasal pillow mask, which is comfortable. Denies air leaks or nasal congestion. Reports feeling more refreshed upon awakening with CPAP therapy.     EPWORTH SLEEP SCORE    07/20/2021    9:00 AM  Results of the Epworth flowsheet  Sitting and reading 1  Watching TV 0  Sitting, inactive in a public place (e.g. a theatre or a meeting) 0  As a passenger in a car for an hour without a break 0  Lying down to rest in the afternoon when circumstances permit 1  Sitting and talking to someone 0  Sitting quietly after a lunch without alcohol 0  In a car, while stopped for a few minutes in traffic 0  Total score 2      PAST MEDICAL HISTORY :   has a past medical history of Abnormal uterine bleeding (AUB), Asthma, Chicken pox, GERD (gastroesophageal reflux disease), Hyperkalemia, Kidney stones, Leukocytosis, Morbid obesity (HCC), OSA on CPAP, and Uterine fibroid.  has a past surgical history that includes Esophagogastroduodenoscopy (egd) with propofol  (N/A, 02/07/2015); Robotic assisted laparoscopic hysterectomy and salpingectomy (Bilateral, 12/28/2022); and Cystoscopy (N/A, 12/28/2022). Prior to Admission medications   Medication Sig Start Date End Date Taking? Authorizing Provider  albuterol  (VENTOLIN  HFA) 108 (90 Base) MCG/ACT inhaler Inhale 1-2 puffs into the lungs every 6 (six) hours as needed for wheezing or shortness of breath (Pt has not had to use in years).   Yes [provider]  calcium carbonate (TUMS - DOSED IN MG ELEMENTAL CALCIUM) 500 MG chewable tablet Chew 1 tablet by mouth as needed for indigestion or heartburn.   Yes [provider]  ibuprofen (ADVIL) 200 MG tablet Take 400 mg by mouth every 6 (six) hours as needed for moderate pain (pain score 4-6).   Yes [provider]  loratadine (CLARITIN) 10 MG tablet Take 10 mg by mouth daily as needed for allergies.   Yes [provider]  omeprazole (PRILOSEC OTC) 20 MG tablet Take 20 mg by mouth as needed.   Yes [provider]  tirzepatide  (ZEPBOUND ) 2.5 MG/0.5ML Pen Inject 2.5 mg into the skin once a week. 05/05/23  Yes Dellar Fenton, MD   Allergies  Allergen Reactions   Ciprofloxacin     Calf pain and muscle aches   Semaglutide      Tingling 1/2 of tongue and lip   Latex Itching and Rash   Sulfa Antibiotics Rash   Sulfonamide Derivatives Rash    FAMILY HISTORY:  family history includes Arthritis in her mother; Asthma in her mother; Bone cancer in her maternal grandfather; Cancer in her maternal grandmother; Depression in her father and mother; Diabetes in her father; Hearing loss in her father; Heart attack in her mother; Heart disease in her mother; Hyperlipidemia in her father; Hypertension in her father; Mental illness in her father; Obesity in her mother. SOCIAL HISTORY:  reports that she has never smoked. She has never used smokeless tobacco. She reports current alcohol use. She reports that she does not use drugs.   Review of Systems:  Gen:  Denies  fever, sweats, chills weight loss  HEENT: Denies  blurred vision, double vision, ear pain, eye pain, hearing loss, nose bleeds, sore throat Cardiac:  No dizziness, chest pain or heaviness, chest tightness,edema, No JVD Resp:   No cough, -sputum production, -shortness of breath,-wheezing, -hemoptysis,  Gi: Denies swallowing difficulty, stomach pain, nausea or vomiting, diarrhea, constipation, bowel incontinence Gu:  Denies bladder incontinence, burning urine Ext:   Denies Joint pain, stiffness or swelling Skin: Denies  skin rash, easy bruising or bleeding or hives Endoc:   Denies polyuria, polydipsia , polyphagia or weight change Psych:   Denies depression, insomnia or hallucinations  Other:  All other systems negative  VITAL SIGNS: BP 122/80 (BP Location: Right Arm, Patient Position: Sitting, Cuff Size: Large)   Pulse (!) 102   Temp 97.7 F (36.5 C) (Oral)   Ht 5' 5.5" (1.664 m)   Wt (!) 321 lb 12.8 oz (146 kg)   SpO2 96%   BMI 52.74 kg/m    Physical Examination:   General Appearance: No distress  EYES PERRLA, EOM intact.   NECK Supple, No JVD Pulmonary: normal breath sounds, No wheezing.  CardiovascularNormal S1,S2.  No m/r/g.   Abdomen: Benign, Soft, non-tender. Skin:   warm, no rashes, no ecchymosis  Extremities: normal, no cyanosis, clubbing. Neuro:without focal findings,  speech normal  PSYCHIATRIC: Mood, affect within normal limits.   ASSESSMENT AND PLAN  OSA Patient is using and benefiting from CPAP therapy. Discussed the consequences of untreated sleep apnea. Advised not to drive drowsy for safety of patient and others. Will follow up in 1 year to review CPAP efficacy and compliance data.    Morbid obesity Counseled patient on diet and lifestyle modification. Patient will be starting on Zepbound  today, will be followed by her PCP.    Patient  satisfied with Plan of action and management. All questions answered  I spent a total of 26 minutes reviewing chart data, face-to-face evaluation with the patient, counseling and coordination of care as detailed above.    Henya Aguallo, M.D.  Sleep Medicine [facility]

## 2023-05-13 NOTE — Patient Instructions (Signed)

## 2023-05-13 NOTE — Addendum Note (Signed)
 Addended by: Aileen Amore on: 05/13/2023 11:18 AM   Modules accepted: Level of Service

## 2023-05-13 NOTE — Telephone Encounter (Signed)
 Pharmacy Patient Advocate Encounter  Received notification from [employer] that Prior Authorization for Zepbound  2.5MG /0.5ML pen-injectors has been APPROVED from 05/10/23 to 11/10/23. Ran test claim, Copay is $24.99. This test claim was processed through [facility]- copay amounts may vary at other pharmacies due to pharmacy/plan contracts, or as the patient moves through the different stages of their insurance plan.   PA #/Case ID/Reference #: [id number]

## 2023-05-14 ENCOUNTER — Ambulatory Visit

## 2023-05-14 ENCOUNTER — Other Ambulatory Visit: Payer: Self-pay

## 2023-05-14 DIAGNOSIS — E66813 Obesity, class 3: Secondary | ICD-10-CM | POA: Diagnosis not present

## 2023-05-14 DIAGNOSIS — Z6841 Body Mass Index (BMI) 40.0 and over, adult: Secondary | ICD-10-CM | POA: Diagnosis not present

## 2023-05-14 NOTE — Progress Notes (Signed)
 Patient in office today to be monitored for first zepbound  injection due to questionable allergic reaction to Wegovy . Patient administered injection to herself SQ in left lower abd. Tolerated injection well. No acute issues noted. Monitored patient for > 1 hour after injection. No acute distress noted during monitoring. Scheduled for next injection next Tuesday.

## 2023-05-14 NOTE — Progress Notes (Signed)
 Reviewed. She was observed for approximately 2 hours after injection. No problems. Lungs clear. No swelling. Feels good. Will call or be evaluated if any problems.

## 2023-05-21 ENCOUNTER — Ambulatory Visit (INDEPENDENT_AMBULATORY_CARE_PROVIDER_SITE_OTHER)

## 2023-05-21 DIAGNOSIS — Z6841 Body Mass Index (BMI) 40.0 and over, adult: Secondary | ICD-10-CM

## 2023-05-21 DIAGNOSIS — E66813 Obesity, class 3: Secondary | ICD-10-CM

## 2023-05-21 NOTE — Progress Notes (Signed)
 Patient in office today to be monitored for second zepbound  injection due to questionable allergic reaction to Wegovy . Patient administered injection to herself SQ in right lower abd. Tolerated injection well. No acute issues noted. Monitored patient for > 1 hour after injection. No acute distress noted during monitoring. Scheduled for next injection next Wednesday.

## 2023-05-28 ENCOUNTER — Ambulatory Visit

## 2023-05-28 DIAGNOSIS — M2391 Unspecified internal derangement of right knee: Secondary | ICD-10-CM | POA: Diagnosis not present

## 2023-05-28 DIAGNOSIS — M25561 Pain in right knee: Secondary | ICD-10-CM | POA: Diagnosis not present

## 2023-05-29 ENCOUNTER — Ambulatory Visit (INDEPENDENT_AMBULATORY_CARE_PROVIDER_SITE_OTHER)

## 2023-05-29 DIAGNOSIS — E66813 Obesity, class 3: Secondary | ICD-10-CM

## 2023-05-29 NOTE — Progress Notes (Signed)
 Patient in office today to be monitored for third zepbound  injection due to questionable allergic reaction to Wegovy . Patient administered injection to herself SQ in left lower abd. Tolerated injection well. No acute issues noted. Monitored patient for > 1 hour after injection. No acute distress noted during monitoring.

## 2023-06-04 DIAGNOSIS — M25561 Pain in right knee: Secondary | ICD-10-CM | POA: Diagnosis not present

## 2023-06-07 DIAGNOSIS — S83511A Sprain of anterior cruciate ligament of right knee, initial encounter: Secondary | ICD-10-CM | POA: Diagnosis not present

## 2023-06-07 DIAGNOSIS — S83241A Other tear of medial meniscus, current injury, right knee, initial encounter: Secondary | ICD-10-CM | POA: Diagnosis not present

## 2023-06-09 ENCOUNTER — Encounter: Payer: Self-pay | Admitting: Internal Medicine

## 2023-06-10 ENCOUNTER — Other Ambulatory Visit: Payer: Self-pay

## 2023-06-10 MED ORDER — TIRZEPATIDE 5 MG/0.5ML ~~LOC~~ SOAJ
5.0000 mg | SUBCUTANEOUS | 2 refills | Status: DC
  Filled 2023-06-10: qty 2, 28d supply, fill #0

## 2023-06-10 NOTE — Telephone Encounter (Signed)
 I am ok to go up to 5mg . Does she want to come in for her first 5mg  dose?

## 2023-06-12 ENCOUNTER — Other Ambulatory Visit: Payer: Self-pay

## 2023-06-12 MED ORDER — ZEPBOUND 5 MG/0.5ML ~~LOC~~ SOAJ
5.0000 mg | SUBCUTANEOUS | 3 refills | Status: DC
  Filled 2023-06-12: qty 2, 28d supply, fill #0
  Filled 2023-07-08: qty 2, 28d supply, fill #1

## 2023-06-12 NOTE — Telephone Encounter (Signed)
 Pt scheduled

## 2023-06-13 ENCOUNTER — Ambulatory Visit

## 2023-06-13 DIAGNOSIS — Z713 Dietary counseling and surveillance: Secondary | ICD-10-CM

## 2023-06-13 NOTE — Progress Notes (Signed)
 Patient in office today to be monitored for zepbound  injection due to questionable allergic reaction to Wegovy . Patient administered injection to herself SQ in right lower abd. Tolerated injection well. No acute issues noted. Monitored patient for > 1 hour after injection. No acute distress noted during monitoring.

## 2023-06-20 ENCOUNTER — Encounter: Payer: Self-pay | Admitting: Adult Health

## 2023-06-20 ENCOUNTER — Ambulatory Visit: Payer: Self-pay | Admitting: Adult Health

## 2023-06-20 ENCOUNTER — Encounter: Payer: Self-pay | Admitting: Internal Medicine

## 2023-06-20 ENCOUNTER — Other Ambulatory Visit: Payer: Self-pay

## 2023-06-20 VITALS — BP 125/96 | HR 91 | Temp 98.8°F | Ht 65.5 in | Wt 308.0 lb

## 2023-06-20 DIAGNOSIS — M25561 Pain in right knee: Secondary | ICD-10-CM | POA: Insufficient documentation

## 2023-06-20 DIAGNOSIS — R051 Acute cough: Secondary | ICD-10-CM

## 2023-06-20 LAB — POC SOFIA 2 FLU + SARS ANTIGEN FIA
Influenza A, POC: NEGATIVE
Influenza B, POC: NEGATIVE
SARS Coronavirus 2 Ag: NEGATIVE

## 2023-06-20 MED ORDER — AZITHROMYCIN 250 MG PO TABS: ORAL_TABLET | ORAL | 0 refills | Status: AC

## 2023-06-20 NOTE — Progress Notes (Signed)
 [facility] [address] [place], [place] [address] Phone: [phone] Fax: [phone]   Office Visit Note  Patient Name: Catherine Fields  Date of [id number]  Med Rec number [id number]  Date of Service: 06/20/2023  Ciprofloxacin, Semaglutide , Latex, Sulfa antibiotics, and Sulfonamide derivatives  Chief Complaint  Patient presents with   Cough    Patient c/o a dry cough/chest tightness x 2 days. She has post-nasal drainage during initial onset of symptoms. Symptoms worsen at night. She has been taking Dayquil which is somewhat helpful. She has not used her albuterol  rescue inhaler since last year.     Cough Associated symptoms include postnasal drip. Pertinent negatives include no chest pain, chills or fever.    Patient is here reporting 2 days ago she started feeling off.  She describes PND, and cough.  Now she's feeling worse, and dayquil seems to help temporarily.   Current Medication:  Outpatient Encounter Medications as of 06/20/2023  Medication Sig   albuterol  (VENTOLIN  HFA) 108 (90 Base) MCG/ACT inhaler Inhale 1-2 puffs into the lungs every 6 (six) hours as needed for wheezing or shortness of breath (Pt has not had to use in years).   azithromycin  (ZITHROMAX ) 250 MG tablet Take 2 tablets on day 1, then 1 tablet daily on days 2 through 5   calcium carbonate (TUMS - DOSED IN MG ELEMENTAL CALCIUM) 500 MG chewable tablet Chew 1 tablet by mouth as needed for indigestion or heartburn.   ibuprofen (ADVIL) 200 MG tablet Take 400 mg by mouth every 6 (six) hours as needed for moderate pain (pain score 4-6).   loratadine (CLARITIN) 10 MG tablet Take 10 mg by mouth daily as needed for allergies.   omeprazole (PRILOSEC OTC) 20 MG tablet Take 20 mg by mouth as needed.   tirzepatide  (ZEPBOUND ) 5 MG/0.5ML Pen Inject 5 mg into the skin once a week.   No facility-administered encounter medications on file as of 06/20/2023.      Medical History: Past Medical History:   Diagnosis Date   Abnormal uterine bleeding (AUB)    Asthma    exercise induced   Chicken pox    GERD (gastroesophageal reflux disease)    Hyperkalemia    Kidney stones    Leukocytosis    Morbid obesity (HCC)    OSA on CPAP    Uterine fibroid      Vital Signs: BP (!) 125/96   Pulse 91   Temp 98.8 F (37.1 C)   Ht 5' 5.5 (1.664 m)   Wt (!) 308 lb (139.7 kg)   SpO2 96%   BMI 50.47 kg/m    Review of Systems  Constitutional:  Negative for chills, fatigue and fever.  HENT:  Positive for postnasal drip.   Eyes:  Negative for pain and itching.  Respiratory:  Positive for cough and chest tightness.   Cardiovascular:  Negative for chest pain.  Gastrointestinal:  Negative for diarrhea, nausea and vomiting.    Physical Exam Vitals reviewed.  Constitutional:      Appearance: Normal appearance.  HENT:     Head: Normocephalic.     Nose: Nose normal.   Eyes:     Pupils: Pupils are equal, round, and reactive to light.    Cardiovascular:     Rate and Rhythm: Normal rate.  Pulmonary:     Effort: Pulmonary effort is normal.     Breath sounds: No wheezing or rhonchi.     Comments: Mildly diminished in bases Lymphadenopathy:  Cervical: No cervical adenopathy.   Neurological:     Mental Status: She is alert.     Assessment/Plan: 1. Acute cough (Primary) Take complete course of antibiotics as prescribed.  Take with food.  If symptoms fail to improve, or new/worse symptoms develop follow up in clinic.  Use Albuterol  as discussed.  - azithromycin  (ZITHROMAX ) 250 MG tablet; Take 2 tablets on day 1, then 1 tablet daily on days 2 through 5  Dispense: 6 tablet; Refill: 0        General Counseling: Saidah verbalizes understanding of the findings of todays visit and agrees with plan of treatment. I have discussed any further diagnostic evaluation that may be needed or ordered today. We also reviewed her medications today. she has been encouraged to call the office with  any questions or concerns that should arise related to todays visit.   No orders of the defined types were placed in this encounter.   Meds ordered this encounter  Medications   azithromycin  (ZITHROMAX ) 250 MG tablet    Sig: Take 2 tablets on day 1, then 1 tablet daily on days 2 through 5    Dispense:  6 tablet    Refill:  0    Time spent:15 Minutes Time spent includes review of chart, medications, test results, and follow up plan with the patient.    Sheria Dills AGNP-C Nurse Practitioner

## 2023-06-26 ENCOUNTER — Ambulatory Visit
Admission: RE | Admit: 2023-06-26 | Discharge: 2023-06-26 | Disposition: A | Discharge status: Home / Self Care | Source: Ambulatory Visit | Attending: Adult Health

## 2023-06-26 ENCOUNTER — Ambulatory Visit (INDEPENDENT_AMBULATORY_CARE_PROVIDER_SITE_OTHER): Payer: Self-pay | Admitting: Adult Health

## 2023-06-26 ENCOUNTER — Encounter: Payer: Self-pay | Admitting: Adult Health

## 2023-06-26 ENCOUNTER — Ambulatory Visit
Admission: RE | Admit: 2023-06-26 | Discharge: 2023-06-26 | Disposition: A | Discharge status: Home / Self Care | Attending: Adult Health | Admitting: Adult Health

## 2023-06-26 VITALS — BP 138/91 | HR 92 | Temp 97.5°F | Ht 65.5 in

## 2023-06-26 DIAGNOSIS — R059 Cough, unspecified: Secondary | ICD-10-CM | POA: Diagnosis not present

## 2023-06-26 DIAGNOSIS — R051 Acute cough: Secondary | ICD-10-CM

## 2023-06-26 DIAGNOSIS — H938X1 Other specified disorders of right ear: Secondary | ICD-10-CM

## 2023-06-26 DIAGNOSIS — R0981 Nasal congestion: Secondary | ICD-10-CM

## 2023-06-26 DIAGNOSIS — R0989 Other specified symptoms and signs involving the circulatory and respiratory systems: Secondary | ICD-10-CM | POA: Diagnosis not present

## 2023-06-26 DIAGNOSIS — R0982 Postnasal drip: Secondary | ICD-10-CM

## 2023-06-26 DIAGNOSIS — J45909 Unspecified asthma, uncomplicated: Secondary | ICD-10-CM | POA: Diagnosis not present

## 2023-06-26 DIAGNOSIS — M25561 Pain in right knee: Secondary | ICD-10-CM | POA: Diagnosis not present

## 2023-06-26 DIAGNOSIS — M25661 Stiffness of right knee, not elsewhere classified: Secondary | ICD-10-CM | POA: Diagnosis not present

## 2023-06-26 MED ORDER — PREDNISONE 10 MG PO TABS: ORAL_TABLET | ORAL | 0 refills | Status: AC

## 2023-06-26 NOTE — Progress Notes (Signed)
 [facility] [address] [place], [place] [address]   Office Visit Note  Patient Name: Catherine Fields Date of Birth [id number]  Medical Record number [id number]  Date of Service: 06/26/2023  Chief Complaint  Patient presents with   Cough    Patient c/o persistent cough and states it is more frequent that during her last visit. Catherine Fields also having postnasal drainage. Catherine Fields finished taking azithromycin  yesterday. Catherine Fields has been using her inhaler twice daily except for yesterday because Catherine Fields forgot to take it with her. Cough causes difficulty sleeping.     Cough Associated symptoms include wheezing. Pertinent negatives include no chest pain, headaches or shortness of breath.   Pt is here for a sick visit. Since being here 6 days ago last Thursday, reports Catherine Fields had sweating/chills last Friday night and Saturday night that has since resolved.   Today complains of ongoing upper respiratory congestion, R ear feels clogged but no pain, and productive cough with difficulty sleeping due to cough.    Has been  using albuterol  as needed and mucinex extra strength. Finished z-pack prescribed last visit.   States Catherine Fields is now feeling better and has normal energy levels, no ongoing fevers/chills, but concerned regarding ongoing cough.    Current Medication:  Outpatient Encounter Medications as of 06/26/2023  Medication Sig   albuterol  (VENTOLIN  HFA) 108 (90 Base) MCG/ACT inhaler Inhale 1-2 puffs into the lungs every 6 (six) hours as needed for wheezing or shortness of breath (Pt has not had to use in years).   calcium carbonate (TUMS - DOSED IN MG ELEMENTAL CALCIUM) 500 MG chewable tablet Chew 1 tablet by mouth as needed for indigestion or heartburn.   ibuprofen (ADVIL) 200 MG tablet Take 400 mg by mouth every 6 (six) hours as needed for moderate pain (pain score 4-6).   loratadine (CLARITIN) 10 MG tablet Take 10 mg by mouth daily as needed for allergies.   omeprazole (PRILOSEC OTC) 20 MG tablet Take  20 mg by mouth as needed.   tirzepatide  (ZEPBOUND ) 5 MG/0.5ML Pen Inject 5 mg into the skin once a week.   No facility-administered encounter medications on file as of 06/26/2023.      Medical History: Past Medical History:  Diagnosis Date   Abnormal uterine bleeding (AUB)    Asthma    exercise induced   Chicken pox    GERD (gastroesophageal reflux disease)    Hyperkalemia    Kidney stones    Leukocytosis    Morbid obesity (HCC)    OSA on CPAP    Uterine fibroid      Vital Signs: BP (!) 138/91   Pulse 92   Temp (!) 97.5 F (36.4 C)   Ht 5' 5.5 (1.664 m)   SpO2 96%   BMI 50.47 kg/m    Review of Systems  Constitutional:  Negative for activity change, appetite change and fatigue.  HENT:  Positive for congestion and voice change. Negative for sinus pain and trouble swallowing.   Eyes:  Negative for pain, discharge and visual disturbance.  Respiratory:  Positive for cough and wheezing. Negative for chest tightness and shortness of breath.   Cardiovascular:  Negative for chest pain and leg swelling.  Gastrointestinal:  Negative for abdominal distention, abdominal pain, constipation and diarrhea.  Musculoskeletal:  Negative for arthralgias, back pain and neck pain.  Skin:  Negative for color change.  Neurological:  Negative for dizziness, weakness and headaches.  Hematological:  Negative for adenopathy.  Psychiatric/Behavioral:  Negative  for confusion and suicidal ideas.     Physical Exam Vitals reviewed.  Constitutional:      Appearance: Normal appearance.  HENT:     Head: Normocephalic.     Right Ear: Tympanic membrane and ear canal normal.     Left Ear: Tympanic membrane and ear canal normal.     Nose: Nose normal.     Mouth/Throat:     Mouth: Mucous membranes are moist.     Pharynx: No posterior oropharyngeal erythema.   Eyes:     Pupils: Pupils are equal, round, and reactive to light.    Cardiovascular:     Rate and Rhythm: Normal rate.  Pulmonary:      Effort: Pulmonary effort is normal.     Breath sounds: Normal breath sounds.  Lymphadenopathy:     Cervical: No cervical adenopathy.   Neurological:     Mental Status: Catherine Fields is alert.     Assessment/Plan:  1. Acute cough  Returns today complaining of ongoing cough 8 days after initial symptoms of upper respiratory infection. Endorses chills 5 days ago that have since resolved, states Catherine Fields is feeling well overall today and back to normal energy levels. Only concern is ongoing productive cough, some ongoing upper respiratory congestion, and ear fullness. Catherine Fields does have a history of asthma and has been taking albuterol . Lung sounds clear on physical exam today. Prescribed 6-day prednisone  taper today using shared decision making with patient, discussed side effects, advised to take in the morning with breakfast. Advised if symptoms do not improve or worsen to return to clinic. Pt decided to get CXR at this time to rule out infection.   2. Nasal sinus congestion  3. R Ear fullness 4.Postnasal drip Physical exam reveals normal Tms bilaterally. No sinus pain. Advised continue to take mucinex, saline sinus rinse. Return to clinic if symptoms do not improve or worsen.  General Counseling: Catherine Fields verbalizes understanding of the findings of todays visit and agrees with plan of treatment. I have discussed any further diagnostic evaluation that may be needed or ordered today. We also reviewed her medications today. Catherine Fields has been encouraged to call the office with any questions or concerns that should arise related to todays visit.   No orders of the defined types were placed in this encounter.   No orders of the defined types were placed in this encounter.   Time spent:20 Minutes    Wilbert JUDITHANN Shade, AGNP-C Nurse Practitioner  Juliene DOROTHA Howells DNP, NP-C Nurse Practitioner [facility]

## 2023-06-27 ENCOUNTER — Ambulatory Visit: Payer: Self-pay | Admitting: Adult Health

## 2023-07-08 DIAGNOSIS — M25561 Pain in right knee: Secondary | ICD-10-CM | POA: Diagnosis not present

## 2023-07-08 DIAGNOSIS — M25661 Stiffness of right knee, not elsewhere classified: Secondary | ICD-10-CM | POA: Diagnosis not present

## 2023-07-15 DIAGNOSIS — M25561 Pain in right knee: Secondary | ICD-10-CM | POA: Diagnosis not present

## 2023-07-15 DIAGNOSIS — M25661 Stiffness of right knee, not elsewhere classified: Secondary | ICD-10-CM | POA: Diagnosis not present

## 2023-07-18 ENCOUNTER — Encounter: Payer: Self-pay | Admitting: Internal Medicine

## 2023-07-21 NOTE — Telephone Encounter (Signed)
 If she is doing ok with the medication, I am ok with increasing the dose, but I want to make sure she is getting adequate protein, staying hydrated and not problems with the medication.

## 2023-07-22 DIAGNOSIS — M25561 Pain in right knee: Secondary | ICD-10-CM | POA: Diagnosis not present

## 2023-07-22 DIAGNOSIS — M25661 Stiffness of right knee, not elsewhere classified: Secondary | ICD-10-CM | POA: Diagnosis not present

## 2023-07-24 NOTE — Telephone Encounter (Signed)
 Please call her and confirm what side effects she is having. If increasing problems, would prefer not to go up on the dose.  Just let me know.

## 2023-07-25 ENCOUNTER — Other Ambulatory Visit: Payer: Self-pay

## 2023-07-25 MED ORDER — TIRZEPATIDE-WEIGHT MANAGEMENT 7.5 MG/0.5ML ~~LOC~~ SOLN
7.5000 mg | SUBCUTANEOUS | 2 refills | Status: DC
Start: 2023-07-25 — End: 2023-07-25

## 2023-07-25 MED ORDER — ZEPBOUND 7.5 MG/0.5ML ~~LOC~~ SOAJ
7.5000 mg | SUBCUTANEOUS | 2 refills | Status: DC
  Filled 2023-07-25 – 2023-07-31 (×2): qty 2, 28d supply, fill #0
  Filled 2023-08-26: qty 2, 28d supply, fill #1
  Filled 2023-09-24: qty 2, 28d supply, fill #2

## 2023-07-25 NOTE — Telephone Encounter (Signed)
 Spoke with pt. Pt state that the only symptom she has is a little queasiness the day after her shot. Pt stated that's generally resides after she eats and doesn't last for long. Pt denied having any stomach pain, vomiting, diarrhea. Pt stated that with wegovy  she had numbness with her mouth but has not experienced that all with Zepbound . Pt states that she has one more refill of the 5mg  but is willing to increase the dosage if pcp is in agreement. Informed pt I would let pcp know and send her a mychart message back once decided. Pt was in agreement

## 2023-07-25 NOTE — Telephone Encounter (Signed)
 Rx ok's for 7.5mg  zepbound . Let us  know if any problems.

## 2023-07-29 ENCOUNTER — Encounter: Payer: Self-pay | Admitting: Internal Medicine

## 2023-07-29 DIAGNOSIS — M25661 Stiffness of right knee, not elsewhere classified: Secondary | ICD-10-CM | POA: Diagnosis not present

## 2023-07-29 DIAGNOSIS — M25561 Pain in right knee: Secondary | ICD-10-CM | POA: Diagnosis not present

## 2023-07-31 ENCOUNTER — Other Ambulatory Visit: Payer: Self-pay

## 2023-07-31 ENCOUNTER — Ambulatory Visit: Admitting: Internal Medicine

## 2023-07-31 VITALS — BP 110/86 | HR 88 | Temp 97.7°F | Ht 65.0 in | Wt 306.8 lb

## 2023-07-31 DIAGNOSIS — E66813 Obesity, class 3: Secondary | ICD-10-CM

## 2023-07-31 DIAGNOSIS — E785 Hyperlipidemia, unspecified: Secondary | ICD-10-CM

## 2023-07-31 DIAGNOSIS — H0264 Xanthelasma of left upper eyelid: Secondary | ICD-10-CM

## 2023-07-31 DIAGNOSIS — Z90710 Acquired absence of both cervix and uterus: Secondary | ICD-10-CM

## 2023-07-31 DIAGNOSIS — Z6841 Body Mass Index (BMI) 40.0 and over, adult: Secondary | ICD-10-CM

## 2023-07-31 DIAGNOSIS — Z87828 Personal history of other (healed) physical injury and trauma: Secondary | ICD-10-CM

## 2023-07-31 LAB — COMPREHENSIVE METABOLIC PANEL WITH GFR
ALT: 23 U/L (ref 0–35)
AST: 17 U/L (ref 0–37)
Albumin: 3.9 g/dL (ref 3.5–5.2)
Alkaline Phosphatase: 64 U/L (ref 39–117)
BUN: 13 mg/dL (ref 6–23)
CO2: 31 meq/L (ref 19–32)
Calcium: 9 mg/dL (ref 8.4–10.5)
Chloride: 102 meq/L (ref 96–112)
Creatinine, Ser: 0.79 mg/dL (ref 0.40–1.20)
GFR: 94.6 mL/min (ref >60.00)
Glucose, Bld: 94 mg/dL (ref 70–99)
Potassium: 4.3 meq/L (ref 3.5–5.1)
Sodium: 139 meq/L (ref 135–145)
Total Bilirubin: 0.2 mg/dL (ref 0.2–1.2)
Total Protein: 7.1 g/dL (ref 6.0–8.3)

## 2023-07-31 LAB — LIPID PANEL
Cholesterol: 173 mg/dL (ref 0–200)
HDL: 38.7 mg/dL — ABNORMAL LOW (ref >39.00)
LDL Cholesterol: 114 mg/dL — ABNORMAL HIGH (ref 0–99)
NonHDL: 134.17
Total CHOL/HDL Ratio: 4
Triglycerides: 103 mg/dL (ref 0.0–149.0)
VLDL: 20.6 mg/dL (ref 0.0–40.0)

## 2023-07-31 LAB — LDL CHOLESTEROL, DIRECT: Direct LDL: 113 mg/dL

## 2023-07-31 LAB — TSH: TSH: 2.16 u[IU]/mL (ref 0.35–5.50)

## 2023-07-31 NOTE — Assessment & Plan Note (Signed)
 Checking lipids/cmet/thyroid  in anticipation of potential initiation of statin therapy

## 2023-07-31 NOTE — Assessment & Plan Note (Signed)
 She has lost 15 lbs thus far with Zepbound  5 mg but weight has plateaued. . She is starting  7.5 mg dose next week

## 2023-07-31 NOTE — Assessment & Plan Note (Signed)
 Done in Dec 2024 for menometrorrhagia. Path reports benign

## 2023-07-31 NOTE — Assessment & Plan Note (Signed)
 She is currently in PT for another 8 weeks,  managed by Kevin Krasinksi

## 2023-07-31 NOTE — Progress Notes (Unsigned)
 Subjective:  Patient ID: Catherine Fields, female    DOB: Sep 03, 1985  Age: 38 y.o. MRN: [id number]  CC: The primary encounter diagnosis was Dyslipidemia. Diagnoses of Xanthelasma of left upper eyelid, Class 3 severe obesity with body mass index (BMI) of 50.0 to 59.9 in adult, History of robot-assisted laparoscopic hysterectomy, and History of tear of ACL (anterior cruciate ligament) were also pertinent to this visit.   HPI Catherine Fields presents for  Chief Complaint  Patient presents with   Acute Visit    Little bump on both inner eye lids with mild discoloration    Catherine Fields is a 38 yr old female with a history of obesity and asthma who presents with a one month history of skin changes to left upper eyelid area described as flat yellow placques.  There is no history of trauma, itching or hyperlipidemia.     Obesity : she is starting  7.5 mg Zepbound    dose  next week;   has lost 15 lbs thus far.  She notes Mild nausea with dose increase.  Exercise has been hindered by a recent ACL tear,  in PT  seeing Krasinksi  next week.     Outpatient Medications Prior to Visit  Medication Sig Dispense Refill   albuterol  (VENTOLIN  HFA) 108 (90 Base) MCG/ACT inhaler Inhale 1-2 puffs into the lungs every 6 (six) hours as needed for wheezing or shortness of breath (Pt has not had to use in years).     calcium carbonate (TUMS - DOSED IN MG ELEMENTAL CALCIUM) 500 MG chewable tablet Chew 1 tablet by mouth as needed for indigestion or heartburn.     ibuprofen (ADVIL) 200 MG tablet Take 400 mg by mouth every 6 (six) hours as needed for moderate pain (pain score 4-6).     loratadine (CLARITIN) 10 MG tablet Take 10 mg by mouth daily as needed for allergies.     omeprazole (PRILOSEC OTC) 20 MG tablet Take 20 mg by mouth as needed.     tirzepatide  (ZEPBOUND ) 7.5 MG/0.5ML Pen Inject 7.5 mg into the skin once a week. 2 mL 2   No facility-administered medications prior to visit.    Review of Systems;  Patient  denies headache, fevers, malaise, unintentional weight loss, skin rash, eye pain, sinus congestion and sinus pain, sore throat, dysphagia,  hemoptysis , cough, dyspnea, wheezing, chest pain, palpitations, orthopnea, edema, abdominal pain, nausea, melena, diarrhea, constipation, flank pain, dysuria, hematuria, urinary  Frequency, nocturia, numbness, tingling, seizures,  Focal weakness, Loss of consciousness,  Tremor, insomnia, depression, anxiety, and suicidal ideation.      Objective:  BP 110/86 (BP Location: Left Arm, Patient Position: Sitting, Cuff Size: Large)   Pulse 88   Temp 97.7 F (36.5 C) (Oral)   Ht 5' 5 (1.651 m)   Wt (!) 306 lb 12.8 oz (139.2 kg)   LMP 12/26/2022   SpO2 95%   BMI 51.05 kg/m   BP Readings from Last 3 Encounters:  07/31/23 110/86  06/26/23 (!) 138/91  06/20/23 (!) 125/96    Wt Readings from Last 3 Encounters:  07/31/23 (!) 306 lb 12.8 oz (139.2 kg)  06/20/23 (!) 308 lb (139.7 kg)  05/13/23 (!) 321 lb 12.8 oz (146 kg)    Physical Exam Vitals reviewed.  Constitutional:      General: She is not in acute distress.    Appearance: Normal appearance. She is normal weight. She is not ill-appearing, toxic-appearing or diaphoretic.  HENT:     Head: Normocephalic.  Eyes:     General: No scleral icterus.       Right eye: No discharge.        Left eye: No discharge.     Conjunctiva/sclera: Conjunctivae normal.      Comments: Small yellow placques above left upper eyelid c/w xanthelasma  Cardiovascular:     Rate and Rhythm: Normal rate and regular rhythm.     Heart sounds: Normal heart sounds.  Pulmonary:     Effort: Pulmonary effort is normal. No respiratory distress.     Breath sounds: Normal breath sounds.  Musculoskeletal:        General: Normal range of motion.  Skin:    General: Skin is warm and dry.  Neurological:     General: No focal deficit present.     Mental Status: She is alert and oriented to person, place, and time. Mental status is  at baseline.  Psychiatric:        Mood and Affect: Mood normal.        Behavior: Behavior normal.        Thought Content: Thought content normal.        Judgment: Judgment normal.     No results found for: HGBA1C  Lab Results  Component Value Date   CREATININE 0.77 12/28/2022   CREATININE 1.02 (H) 12/24/2022   CREATININE 0.81 05/08/2021    Lab Results  Component Value Date   WBC 12.4 (H) 12/28/2022   HGB 14.8 12/28/2022   HCT 45.6 12/28/2022   PLT 415 (H) 12/28/2022   GLUCOSE 122 (H) 12/28/2022   CHOL 184 05/08/2021   TRIG 105.0 05/08/2021   HDL 50.70 05/08/2021   LDLCALC 113 (H) 05/08/2021   ALT 19 05/08/2021   AST 15 05/08/2021   NA 136 12/28/2022   K 4.0 12/28/2022   CL 102 12/28/2022   CREATININE 0.77 12/28/2022   BUN 12 12/28/2022   CO2 24 12/28/2022   TSH 4.94 05/08/2021    DG Chest 2 View Result Date: 06/27/2023 CLINICAL DATA:  Dry cough for a week.  History of asthma. EXAM: CHEST - 2 VIEW COMPARISON:  08/25/2014 and older studies. FINDINGS: Normal heart, mediastinum and hila. Lung volumes are low. Lungs are clear. No pleural effusion or pneumothorax. Skeletal structures are unremarkable. IMPRESSION: No active cardiopulmonary disease. Electronically Signed   By: Alm Parkins M.D.   On: 06/27/2023 11:34    Assessment & Plan:  .Dyslipidemia -     LDL cholesterol, direct -     TSH -     Comprehensive metabolic panel with GFR -     Lipid panel  Xanthelasma of left upper eyelid Assessment & Plan: Checking lipids/cmet/thyroid  in anticipation of potential initiation of statin therapy   Class 3 severe obesity with body mass index (BMI) of 50.0 to 59.9 in adult Assessment & Plan: She has lost 15 lbs thus far with Zepbound  5 mg but weight has plateaued. . She is starting  7.5 mg dose next week    History of robot-assisted laparoscopic hysterectomy Assessment & Plan: Done in Dec 2024 for menometrorrhagia. Path reports benign    History of tear of ACL  (anterior cruciate ligament) Assessment & Plan: She is currently in PT for another 8 weeks,  managed by Kevin Krasinksi        I spent 34 minutes on the day of this face to face encounter reviewing patient's  most recent visit with orthopedics gynecology,   prior relevant surgical and non surgical  procedures, recent  labs and imaging studies, counseling on weight management,  reviewing the assessment and plan with patient, and post visit ordering and reviewing of  diagnostics and therapeutics with patient  .   Follow-up: No follow-ups on file.   Verneita LITTIE Kettering, MD

## 2023-07-31 NOTE — Patient Instructions (Signed)
 Nice to meet you!  The placque on your upper eyelid is called a xanthelasma  .  It is not cancer or contagious,  and often occurs when cholesterol is high  If your cholesterol is elevated on today's labs,  treating the high cholesterol MAY help the X to resolve. There are other cosmetic procedures that can be done,  but they are costly and NOT guaranteed to work long term, so my advice is not to worry about it because it is not visible unless you close your eyes  .

## 2023-08-01 ENCOUNTER — Ambulatory Visit: Payer: Self-pay | Admitting: Internal Medicine

## 2023-08-05 DIAGNOSIS — M25661 Stiffness of right knee, not elsewhere classified: Secondary | ICD-10-CM | POA: Diagnosis not present

## 2023-08-05 DIAGNOSIS — M25561 Pain in right knee: Secondary | ICD-10-CM | POA: Diagnosis not present

## 2023-08-07 NOTE — Telephone Encounter (Signed)
 Fyi updated on 7.5mg 

## 2023-08-08 DIAGNOSIS — S83511D Sprain of anterior cruciate ligament of right knee, subsequent encounter: Secondary | ICD-10-CM | POA: Diagnosis not present

## 2023-08-08 DIAGNOSIS — M23351 Other meniscus derangements, posterior horn of lateral meniscus, right knee: Secondary | ICD-10-CM | POA: Diagnosis not present

## 2023-08-08 DIAGNOSIS — S83411D Sprain of medial collateral ligament of right knee, subsequent encounter: Secondary | ICD-10-CM | POA: Diagnosis not present

## 2023-08-12 DIAGNOSIS — M25661 Stiffness of right knee, not elsewhere classified: Secondary | ICD-10-CM | POA: Diagnosis not present

## 2023-08-12 DIAGNOSIS — M25561 Pain in right knee: Secondary | ICD-10-CM | POA: Diagnosis not present

## 2023-08-19 DIAGNOSIS — M25561 Pain in right knee: Secondary | ICD-10-CM | POA: Diagnosis not present

## 2023-08-19 DIAGNOSIS — M25661 Stiffness of right knee, not elsewhere classified: Secondary | ICD-10-CM | POA: Diagnosis not present

## 2023-08-26 DIAGNOSIS — M25661 Stiffness of right knee, not elsewhere classified: Secondary | ICD-10-CM | POA: Diagnosis not present

## 2023-08-26 DIAGNOSIS — M25561 Pain in right knee: Secondary | ICD-10-CM | POA: Diagnosis not present

## 2023-08-28 DIAGNOSIS — G4733 Obstructive sleep apnea (adult) (pediatric): Secondary | ICD-10-CM | POA: Diagnosis not present

## 2023-09-04 DIAGNOSIS — M25561 Pain in right knee: Secondary | ICD-10-CM | POA: Diagnosis not present

## 2023-09-04 DIAGNOSIS — M25661 Stiffness of right knee, not elsewhere classified: Secondary | ICD-10-CM | POA: Diagnosis not present

## 2023-09-09 DIAGNOSIS — M25661 Stiffness of right knee, not elsewhere classified: Secondary | ICD-10-CM | POA: Diagnosis not present

## 2023-09-09 DIAGNOSIS — M25561 Pain in right knee: Secondary | ICD-10-CM | POA: Diagnosis not present

## 2023-09-16 DIAGNOSIS — M25661 Stiffness of right knee, not elsewhere classified: Secondary | ICD-10-CM | POA: Diagnosis not present

## 2023-09-16 DIAGNOSIS — M25561 Pain in right knee: Secondary | ICD-10-CM | POA: Diagnosis not present

## 2023-09-23 DIAGNOSIS — M25661 Stiffness of right knee, not elsewhere classified: Secondary | ICD-10-CM | POA: Diagnosis not present

## 2023-09-23 DIAGNOSIS — M25561 Pain in right knee: Secondary | ICD-10-CM | POA: Diagnosis not present

## 2023-10-02 DIAGNOSIS — S83511D Sprain of anterior cruciate ligament of right knee, subsequent encounter: Secondary | ICD-10-CM | POA: Diagnosis not present

## 2023-10-02 DIAGNOSIS — S83411D Sprain of medial collateral ligament of right knee, subsequent encounter: Secondary | ICD-10-CM | POA: Diagnosis not present

## 2023-10-02 DIAGNOSIS — S83241D Other tear of medial meniscus, current injury, right knee, subsequent encounter: Secondary | ICD-10-CM | POA: Diagnosis not present

## 2023-10-09 ENCOUNTER — Encounter: Payer: Self-pay | Admitting: Internal Medicine

## 2023-10-09 NOTE — Telephone Encounter (Signed)
 Please call Catherine Fields. If she is tolerating and feels things have leveled off, then she can increase to 10mg  weekly. Will need to follow symptoms. Any problems, let me know.

## 2023-10-10 ENCOUNTER — Other Ambulatory Visit: Payer: Self-pay

## 2023-10-10 MED ORDER — TIRZEPATIDE-WEIGHT MANAGEMENT 10 MG/0.5ML ~~LOC~~ SOAJ
10.0000 mg | SUBCUTANEOUS | 3 refills | Status: DC
  Filled 2023-10-10 – 2023-10-22 (×2): qty 2, 28d supply, fill #0
  Filled 2023-11-17: qty 2, 28d supply, fill #1
  Filled 2023-12-12: qty 2, 28d supply, fill #2
  Filled 2024-01-09: qty 2, 28d supply, fill #3

## 2023-10-10 NOTE — Telephone Encounter (Signed)
 Called Catherine Fields. Rx increased to 10 mg. Sent to [facility]

## 2023-10-14 ENCOUNTER — Other Ambulatory Visit (HOSPITAL_COMMUNITY): Payer: Self-pay

## 2023-10-21 DIAGNOSIS — M25561 Pain in right knee: Secondary | ICD-10-CM | POA: Diagnosis not present

## 2023-10-21 DIAGNOSIS — M25661 Stiffness of right knee, not elsewhere classified: Secondary | ICD-10-CM | POA: Diagnosis not present

## 2023-10-22 ENCOUNTER — Other Ambulatory Visit: Payer: Self-pay

## 2023-11-04 DIAGNOSIS — M25561 Pain in right knee: Secondary | ICD-10-CM | POA: Diagnosis not present

## 2023-11-04 DIAGNOSIS — M25661 Stiffness of right knee, not elsewhere classified: Secondary | ICD-10-CM | POA: Diagnosis not present

## 2023-11-11 DIAGNOSIS — M25661 Stiffness of right knee, not elsewhere classified: Secondary | ICD-10-CM | POA: Diagnosis not present

## 2023-11-11 DIAGNOSIS — M25561 Pain in right knee: Secondary | ICD-10-CM | POA: Diagnosis not present

## 2023-12-02 DIAGNOSIS — M25661 Stiffness of right knee, not elsewhere classified: Secondary | ICD-10-CM | POA: Diagnosis not present

## 2023-12-02 DIAGNOSIS — M25561 Pain in right knee: Secondary | ICD-10-CM | POA: Diagnosis not present

## 2023-12-09 DIAGNOSIS — M25561 Pain in right knee: Secondary | ICD-10-CM | POA: Diagnosis not present

## 2023-12-09 DIAGNOSIS — M25661 Stiffness of right knee, not elsewhere classified: Secondary | ICD-10-CM | POA: Diagnosis not present

## 2023-12-25 DIAGNOSIS — M25661 Stiffness of right knee, not elsewhere classified: Secondary | ICD-10-CM | POA: Diagnosis not present

## 2023-12-25 DIAGNOSIS — M25561 Pain in right knee: Secondary | ICD-10-CM | POA: Diagnosis not present

## 2024-01-05 ENCOUNTER — Encounter: Payer: Self-pay | Admitting: Internal Medicine

## 2024-01-06 NOTE — Telephone Encounter (Signed)
 Please call her and see how she is doing today. Let her know that we did not get her message over the weekend. If having diarrhea, need to confirm how many stools per day. Eating and drinking?

## 2024-02-07 ENCOUNTER — Encounter: Payer: Self-pay | Admitting: Internal Medicine

## 2024-02-07 MED ORDER — ZEPBOUND 12.5 MG/0.5ML ~~LOC~~ SOAJ
12.5000 mg | SUBCUTANEOUS | 0 refills | Status: AC
  Filled 2024-02-07: qty 6, 84d supply, fill #0

## 2024-02-07 NOTE — Telephone Encounter (Signed)
 Spoke to pt. Pt stated she has been doing good on the lower dosage. No reactions or complaints. Scheduled pt for a follow up on 02/26/24. Pt also stated she thinks she is over due for her annual. Informed pt pcp would review and let her know when she comes in.   New dosage pended for approval. Med goes to [facility]

## 2024-02-07 NOTE — Telephone Encounter (Signed)
 Ok to make her appt a physical. Rx sent in for 12.5mg  dose.

## 2024-02-07 NOTE — Telephone Encounter (Signed)
 I am ok to work with her and adjust medication, but need to schedule a f/u appt with me. Overdue. Thanks.

## 2024-02-26 ENCOUNTER — Ambulatory Visit: Admitting: Internal Medicine

## 2024-04-06 ENCOUNTER — Encounter: Admitting: Dermatology
